# Patient Record
Sex: Male | Born: 1935 | Race: White | Hispanic: No | Smoking: Former smoker
Health system: Southern US, Community
[De-identification: ages and names within clinical notes are randomized; demographics above are authoritative.]

## PROBLEM LIST (undated history)

## (undated) DIAGNOSIS — J449 Chronic obstructive pulmonary disease, unspecified: Secondary | ICD-10-CM

## (undated) DIAGNOSIS — L570 Actinic keratosis: Secondary | ICD-10-CM

## (undated) DIAGNOSIS — I10 Essential (primary) hypertension: Secondary | ICD-10-CM

## (undated) DIAGNOSIS — K219 Gastro-esophageal reflux disease without esophagitis: Secondary | ICD-10-CM

## (undated) DIAGNOSIS — H9190 Unspecified hearing loss, unspecified ear: Secondary | ICD-10-CM

## (undated) DIAGNOSIS — D126 Benign neoplasm of colon, unspecified: Secondary | ICD-10-CM

## (undated) DIAGNOSIS — K5790 Diverticulosis of intestine, part unspecified, without perforation or abscess without bleeding: Secondary | ICD-10-CM

## (undated) DIAGNOSIS — E669 Obesity, unspecified: Secondary | ICD-10-CM

## (undated) DIAGNOSIS — I2510 Atherosclerotic heart disease of native coronary artery without angina pectoris: Secondary | ICD-10-CM

## (undated) DIAGNOSIS — E785 Hyperlipidemia, unspecified: Secondary | ICD-10-CM

## (undated) DIAGNOSIS — K2270 Barrett's esophagus without dysplasia: Secondary | ICD-10-CM

## (undated) HISTORY — DX: Diverticulosis of intestine, part unspecified, without perforation or abscess without bleeding: K57.90

## (undated) HISTORY — DX: Benign neoplasm of colon, unspecified: D12.6

## (undated) HISTORY — DX: Gastro-esophageal reflux disease without esophagitis: K21.9

## (undated) HISTORY — DX: Unspecified hearing loss, unspecified ear: H91.90

## (undated) HISTORY — DX: Barrett's esophagus without dysplasia: K22.70

## (undated) HISTORY — DX: Chronic obstructive pulmonary disease, unspecified: J44.9

## (undated) HISTORY — DX: Hyperlipidemia, unspecified: E78.5

## (undated) HISTORY — DX: Essential (primary) hypertension: I10

## (undated) HISTORY — DX: Atherosclerotic heart disease of native coronary artery without angina pectoris: I25.10

## (undated) HISTORY — DX: Obesity, unspecified: E66.9

## (undated) HISTORY — DX: Actinic keratosis: L57.0

---

## 1996-04-27 HISTORY — PX: TESTICLE SURGERY: SHX794

## 1998-02-08 ENCOUNTER — Other Ambulatory Visit: Admission: RE | Admit: 1998-02-08 | Discharge: 1998-02-08 | Payer: Self-pay | Admitting: Gastroenterology

## 1999-03-28 ENCOUNTER — Encounter (INDEPENDENT_AMBULATORY_CARE_PROVIDER_SITE_OTHER): Payer: Self-pay | Admitting: Specialist

## 1999-03-28 ENCOUNTER — Other Ambulatory Visit: Admission: RE | Admit: 1999-03-28 | Discharge: 1999-03-28 | Payer: Self-pay | Admitting: Gastroenterology

## 2006-11-17 ENCOUNTER — Ambulatory Visit: Payer: Self-pay | Admitting: Gastroenterology

## 2006-11-26 ENCOUNTER — Ambulatory Visit: Payer: Self-pay | Admitting: Gastroenterology

## 2006-11-26 ENCOUNTER — Encounter: Payer: Self-pay | Admitting: Gastroenterology

## 2008-11-05 ENCOUNTER — Encounter (INDEPENDENT_AMBULATORY_CARE_PROVIDER_SITE_OTHER): Payer: Self-pay | Admitting: *Deleted

## 2009-06-14 ENCOUNTER — Telehealth: Payer: Self-pay | Admitting: Gastroenterology

## 2009-06-17 ENCOUNTER — Encounter: Payer: Self-pay | Admitting: Gastroenterology

## 2009-06-17 ENCOUNTER — Ambulatory Visit: Payer: Self-pay | Admitting: Internal Medicine

## 2009-06-17 DIAGNOSIS — R1319 Other dysphagia: Secondary | ICD-10-CM

## 2009-06-17 DIAGNOSIS — Z860100 Personal history of colon polyps, unspecified: Secondary | ICD-10-CM | POA: Insufficient documentation

## 2009-06-17 DIAGNOSIS — K219 Gastro-esophageal reflux disease without esophagitis: Secondary | ICD-10-CM | POA: Insufficient documentation

## 2009-06-17 DIAGNOSIS — E1140 Type 2 diabetes mellitus with diabetic neuropathy, unspecified: Secondary | ICD-10-CM | POA: Insufficient documentation

## 2009-06-17 DIAGNOSIS — I10 Essential (primary) hypertension: Secondary | ICD-10-CM | POA: Insufficient documentation

## 2009-06-17 DIAGNOSIS — R1310 Dysphagia, unspecified: Secondary | ICD-10-CM | POA: Insufficient documentation

## 2009-06-17 DIAGNOSIS — K5730 Diverticulosis of large intestine without perforation or abscess without bleeding: Secondary | ICD-10-CM | POA: Insufficient documentation

## 2009-06-17 DIAGNOSIS — K2270 Barrett's esophagus without dysplasia: Secondary | ICD-10-CM

## 2009-06-21 ENCOUNTER — Ambulatory Visit: Payer: Self-pay | Admitting: Gastroenterology

## 2009-06-25 ENCOUNTER — Encounter: Payer: Self-pay | Admitting: Gastroenterology

## 2010-05-29 NOTE — Miscellaneous (Signed)
  Clinical Lists Changes  Medications: Added new medication of PEPCID 40 MG  TABS (FAMOTIDINE) take 1 tab once daily - Signed Rx of PEPCID 40 MG  TABS (FAMOTIDINE) take 1 tab once daily;  #30 x 1;  Signed;  Entered by: Louis Meckel MD;  Authorized by: Louis Meckel MD;  Method used: [facility] [address] [phone]*, [address], [place], [place]  [address], Ph: [phone] or [phone], Fax: [phone]    Prescriptions: PEPCID 40 MG  TABS (FAMOTIDINE) take 1 tab once daily  #30 x 1   Entered and Authorized by:   Louis Meckel MD   Signed by:   Louis Meckel MD on 06/21/2009   Method used:   [facility]ronically to        [facility] [address] [phone]* (retail)       [address]       [place], [place]  [address]       Ph: [phone] or [phone]       Fax: [phone]   RxID:   [id number]

## 2010-05-29 NOTE — Procedures (Signed)
Summary: Upper Endoscopy  Patient: Philip Hatfield Note: All result statuses are Final unless otherwise noted.  Tests: (1) Upper Endoscopy (EGD)   EGD Upper Endoscopy       DONE     [facility]     [address]     [place], [place]  [address]           ENDOSCOPY PROCEDURE REPORT           PATIENT:  [name], [name]  MR#:  [id number]     BIRTHDATE:  [birth year], 73 yrs. old  GENDER:  male           ENDOSCOPIST:  Barbette Hair. Arlyce Dice, MD     Referred by:           PROCEDURE DATE:  06/21/2009     PROCEDURE:  EGD with biopsy, Maloney Dilation of Esophagus     ASA CLASS:  Class III     INDICATIONS:  dysphagia, h/o Barrett's Esophagus           MEDICATIONS:   Fentanyl 25 mcg IV, Versed 3 mg IV, glycopyrrolate     (Robinal) 0.2 mg IV, 0.6cc simethancone 0.6 cc PO     TOPICAL ANESTHETIC:  Exactacain Spray           DESCRIPTION OF PROCEDURE:   After the risks benefits and     alternatives of the procedure were thoroughly explained, informed     consent was obtained.  The [facility] [id number] endoscope was     introduced through the mouth and advanced to the third portion of     the duodenum, without limitations.  The instrument was slowly     withdrawn as the mucosa was fully examined.     <<PROCEDUREIMAGES>>           Barrett's esophagus was found at the gastroesophageal junction.     Barrett's esophagus extending 2-3cm proximally. Multiple bxs     taken. 52Fr maloney dilator was passed b/c of dysphagia (see     image1 and image4).  Multiple erosions were found in the antrum     (see image2). Several superficial erosions  diverticulitis in the     bulb of the duodenum (see image3).  Otherwise the examination was     normal.    Retroflexed views revealed no abnormalities.    The     scope was then withdrawn from the patient and the procedure     completed.           COMPLICATIONS:  None           ENDOSCOPIC IMPRESSION:     1) Barrett's esophagus at the gastroesophageal junction    2) Erosions, multiple in the antrum; duodenitis     3) Diverticulitis in the bulb of duodenum     4) Otherwise normal examination     RECOMMENDATIONS:     1) Pepcid 40 mg qd     2) followup as needed           REPEAT EXAM:   You will receive a letter from Dr. Arlyce Dice in 1-2     weeks, after reviewing the final pathology, with followup     recommendations.           ______________________________     Barbette Hair Arlyce Dice, MD           CC:  Royanne Foots MD           n.  eSIGNED:   Barbette Hair. Natanya Holecek at 06/21/2009 11:00 AM           Philip Hatfield, [id number]  Note: An exclamation mark (!) indicates a result that was not dispersed into the flowsheet. Document Creation Date: 06/21/2009 11:00 AM _______________________________________________________________________  (1) Order result status: Final Collection or observation date-time: 06/21/2009 10:46 Requested date-time:  Receipt date-time:  Reported date-time:  Referring Physician:   Ordering Physician: Melvia Heaps [phone]) Specimen Source:  Source: Launa Grill Order Number: [id number] Lab site:   Appended Document: Endoscopy     Procedures Next Due Date:    EGD: 05/2011

## 2010-05-29 NOTE — Progress Notes (Signed)
Summary: TRIAGE--Dysphagia   Phone Note Call from Patient Call back at Home Phone [phone]   Caller: wife, Kendal Hymen Call For: Dr. Arlyce Dice Reason for Call: Talk to Nurse Summary of Call: wife reports pt is having dysphagia and cannot wait until next available Initial call taken by: Vallarie Mare,  June 14, 2009 8:24 AM  Follow-up for Phone Call        No answer, I will try back later. Laureen Ochs LPN  June 14, 2009 9:09 AM   Per pt. wife, 1 week of trouble swallowing. Hx. Barretts. Takes an antacid as needed. He is in [facility] today, cannot come to office until Monday.  1) See Amy Esterwood PAC 06-17-09 at 9am 2) Soft,bland diet. Be very careful with meats,rice and breads. 3) Prilosec OTC two times a day until appt. 4) If symptoms become worse call back immediately or go to ER.   Follow-up by: Laureen Ochs LPN,  June 14, 2009 9:44 AM

## 2010-05-29 NOTE — Letter (Signed)
Summary: Patient Notice-Barrett's [facility]  [address] [place], [place] [address]   Phone: [phone]  Fax: [phone]        June 25, 2009 MRN: [id number]    Philip Hatfield [address] [place], [place]  [address]    Dear Mr. BADIE,  I am pleased to inform you that the biopsies taken during your recent endoscopic examination did not show any evidence of cancer upon pathologic examination.  However, your biopsies indicate you have a condition known as Barrett's esophagus. While not cancer, it is pre-cancerous (can progress to cancer) and needs to be monitored with repeat endoscopic examination and biopsies.  Fortunately, it is quite rare that this develops into cancer, but careful monitoring of the condition along with taking your medication as prescribed is important in reducing the risk of developing cancer.  It is my recommendation that you have a repeat upper gastrointestinal endoscopic examination in _2 years.  Additional information/recommendations:  __Please call [phone] to schedule a return visit to further      evaluate your condition.  _x_Continue with treatment plan as outlined the day of your exam.  Please call us if you have or develop heartburn, reflux symptoms, any swallowing problems, or if you have questions about your condition that have not been fully answered at this time.  Sincerely,  Louis Meckel MD  This letter has been electronically signed by your physician.  Appended Document: Patient Notice-Barrett's Esopghagus letter mailed 3.3.11

## 2010-05-29 NOTE — Procedures (Signed)
Summary: [facility] COLON   Colonoscopy  Procedure date:  11/26/2006  Findings:      Location:  [facility].   Patient Name: Philip Hatfield, Philip Hatfield MRN:  Procedure Procedures: Colonoscopy CPT: 20254.  Personnel: Endoscopist: Barbette Hair. Arlyce Dice, MD.  Patient Consent: Procedure, Alternatives, Risks and Benefits discussed, consent obtained, from patient.  Indications  Surveillance of: Adenomatous Polyp(s). 2005.  History Comments: Patient history reviewed/updated, physical performed prior to initiation of sedation? Exam Exam: Extent of exam reached: Cecum, extent intended: Cecum.  The cecum was identified by appendiceal orifice and IC valve. Time to Cecum: 00:02: 41. Time for Withdrawl: 00:07:02. Colon retroflexion performed. ASA Classification: II. Tolerance: good.  Monitoring: Pulse and BP monitoring, Oximetry used. Supplemental O2 given. at 2 Liters.  Colon Prep Used Miralax for colon prep.  Sedation Meds: Patient assessed and found to be appropriate for moderate (conscious) sedation. Sedation was managed by the Endoscopist. Fentanyl given IV. Versed given IV.  Findings - NORMAL EXAM: Ascending Colon to Descending Colon.  - DIVERTICULOSIS: Descending Colon to Sigmoid Colon. ICD9: Diverticulosis: 562.10. Comments: Diffuse diverticulosis.  OTHER FINDING: Ascending Colon. Comments: 3cm lipoma.  NORMAL EXAM: Cecum.  - NORMAL EXAM: Sigmoid Colon to Rectum.   Assessment Abnormal examination, see findings above.  Diagnoses: 562.10: Diverticulosis.   Events  Unplanned Interventions: No intervention was required.  Unplanned Events: There were no complications. Plans  Post Exam Instructions: Post sedation instructions given.  Patient Education: Patient given standard instructions for: Diverticulosis.  Disposition: After procedure patient sent to recovery. After recovery patient sent home.  Scheduling/Referral: Colonoscopy, to Barbette Hair. Arlyce Dice, MD, around Nov 25, 2013.    cc: Royanne Foots, MD  This report was created from the original endoscopy report, which was reviewed and signed by the above listed endoscopist.

## 2010-05-29 NOTE — Procedures (Signed)
Summary: [facility] EGD   EGD  Procedure date:  11/26/2006  Findings:      Location: [facility]   Patient Name: Philip Hatfield, Philip Hatfield MRN:  Procedure Procedures: Panendoscopy (EGD) CPT: 43235.    with biopsy(s)/brushing(s). CPT: D1846139.  Personnel: Endoscopist: Barbette Hair. Arlyce Dice, MD.  Patient Consent: Procedure, Alternatives, Risks and Benefits discussed, consent obtained, from patient.  Indications  Surveillance of: Barrett's Esophagus.  Exam Exam Info: Maximum depth of insertion Duodenum, intended Duodenum. Patient position: on left side. ASA Classification: II. Tolerance: excellent.  Sedation Meds: Patient assessed and found to be appropriate for moderate (conscious) sedation. Residual sedation present from prior procedure today. Fentanyl given IV. Versed given IV. Robinul 0.2 given IV. Cetacaine Spray 2 sprays given aerosolized.  Monitoring: BP and pulse monitoring done. Oximetry used. Supplemental O2 given at 2 Liters.  Findings - Normal: Proximal Esophagus to Mid Esophagus.  - Normal: Cardia to Duodenal 2nd Portion.  - BARRETT'S ESOPHAGUS:  established. Proximal margin 37 cm from mouth,  distal margin 40 cm. Length of Barrett's 3 cm. Biopsy/Barrett's taken. ICD9: Barrett's: 530.2. Comment: One area of nodularity extending 1cm.   Assessment Abnormal examination, see findings above.  Diagnoses: 530.2: Barrett's.   Events  Unplanned Intervention: No unplanned interventions were required.  Unplanned Events: There were no complications. Plans Medication(s): Await pathology.  Scheduling: EGD, to Molly Maduro D. Arlyce Dice, MD, around Nov 25, 2008.    cc: Royanne Foots, MD  This report was created from the original endoscopy report, which was reviewed and signed by the above listed endoscopist.

## 2010-05-29 NOTE — Assessment & Plan Note (Signed)
Summary: WORSENING DYSPHAGIA, HX.BARRETT'S       (DR.KAPLAN PT)   DEBORAH    History of Present Illness Visit Type: Initial Visit Primary GI MD: Melvia Heaps MD [facility] Chief Complaint: Pt states x 1 week ago pt realized his throat has been really sore. Pt states the soreness would come and go. Pt also states his some solid food dysphagia.  History of Present Illness:   75 YO. MALE KNOWN TO DR. KAPLAN WITH HX.OF BARRETTS ESOPHAGUS,AND COLON POLYPS. LAST EGD WAS DONE IN 8/08 WITH BARRETTS ON BX-NO DYSPLASIA. HE DID NOT HAVE A STRICTURE. LAST COLONOSCOPY WAS ALSO DONE 8/08 WITH DIFFUSE DIVERTICULOSIS,A CECAL LIPOMA,NO POLYPS.  HE COMES IN TODAY WITH NEW C/O SOLID FOOD DYSPHAGIA WHICH HAS BEEN PERSISTENT OVER THE LAST COUPLE WEEKS. HE HAS ALSO BEEN HAVING HEARTBURN AND INDIGESTION.HE USES TUMS WITHOUT MUCH BENEFIT-IS NOT ON A REGULAR PPI. HE DENIES ANY DIFFICULTY SWALLOWING LIQUIDS BUT FEELS SOLIDS ARE STICKING.NO REGURGITATION. DENIES ABDOMINAL PAIN, NO NAUSEA, VOMITING ETC. BM'S NORMAL,NO MELENA OR HEME.HE TAKES INDOCIN PERIODICALLY FOR GOUT.   GI Review of Systems    Reports acid reflux, bloating, and  dysphagia with solids.      Denies abdominal pain, belching, chest pain, dysphagia with liquids, heartburn, loss of appetite, nausea, vomiting, vomiting blood, weight loss, and  weight gain.        Denies anal fissure, black tarry stools, change in bowel habit, constipation, diarrhea, diverticulosis, fecal incontinence, heme positive stool, hemorrhoids, irritable bowel syndrome, jaundice, light color stool, liver problems, rectal bleeding, and  rectal pain. Preventive Screening-Counseling & Management  Alcohol-Tobacco     Smoking Status: quit      Drug Use:  no.      Current Medications (verified): 1)  Atenolol 50 Mg Tabs (Atenolol) .... One Tablet By Mouth Once Daily 2)  Metformin Hcl 500 Mg Tabs (Metformin Hcl) .... One Tablet By Mouth Two Times A Day 3)  Tums 500 Mg Chew (Calcium  Carbonate Antacid) .... 2-3 Tablets By Mouth Once Daily 4)  Indomethacin 50 Mg Caps (Indomethacin) .... One Tablet By Mouth Three Times A Day 5)  Furosemide 20 Mg Tabs (Furosemide) .... One Tablet By Mouth Once Daily 6)  Tamsulosin Hcl 0.4 Mg Caps (Tamsulosin Hcl) .... One Tablet By Mouth Every 12 Hours 7)  Diovan Hct 160-25 Mg Tabs (Valsartan-Hydrochlorothiazide) .... One Tablet By Mouth Once Daily 8)  Aspirin 325 Mg  Tabs (Aspirin) .... One Tablet By Mouth Once Daily  Allergies (verified): No Known Drug Allergies  Past History:  Past Medical History: Barretts Esophagus DIFFUSE DIVERTICULOSIS Adenomatous Colon Polyps 2005,NO POLYPS 2008 Ephysema COPD Diabetes Obesity  Past Surgical History: Unremarkable  Family History: Family History of Prostate Cancer:Brother and Father Family History of Diabetes: Uncles Family History of Heart Disease: Mother, Father  Social History: Married  Retired Patient is a former smoker.  Alcohol Use - no Daily Caffeine Use Illicit Drug Use - no Smoking Status:  quit Drug Use:  no  Review of Systems       The patient complains of hearing problems, shortness of breath, sore throat, and swelling of feet/legs.  The patient denies allergy/sinus, anemia, anxiety-new, arthritis/joint pain, back pain, blood in urine, breast changes/lumps, change in vision, confusion, cough, coughing up blood, depression-new, fainting, fatigue, fever, headaches-new, heart murmur, heart rhythm changes, itching, menstrual pain, muscle pains/cramps, night sweats, nosebleeds, pregnancy symptoms, skin rash, sleeping problems, swollen lymph glands, thirst - excessive , urination - excessive , urination changes/pain, urine leakage, vision  changes, and voice change.         ROS OTHERWISE AS IN HPI  Vital Signs:  Patient profile:   75 year old male Height:      71 inches Weight:      291 pounds BMI:     40.73 Pulse rate:   78 / minute Pulse rhythm:   regular BP sitting:    178 / 84  (right arm) Cuff size:   regular  Vitals Entered By: Christie Nottingham CMA Duncan Dull) (June 17, 2009 9:19 AM)  Physical Exam  General:  Well developed, well nourished, no acute distress. Head:  Normocephalic and atraumatic. Eyes:  PERRLA, no icterus. Lungs:  Clear throughout to auscultation. Heart:  Regular rate and rhythm; no murmurs, rubs,  or bruits. Abdomen:  OBESE,SOFT, NONTENDER, NO MASS OR HSM,BS+ Rectal:  NOT DONE Extremities:  No clubbing, cyanosis, edema or deformities noted. Neurologic:  Alert and  oriented x4;  grossly normal neurologically. Psych:  Alert and cooperative. Normal mood and affect.   Impression & Recommendations:  Problem # 1:  DYSPHAGIA (ICD-787.29) Assessment New 75 YO MALE WITH HX OF BARRETTS ESOPHAGUS WITH NEW C/O SOLID FOOD DYSPHAGIA, R/O STRICTURE,R/O MALIGNANCY.  START PRILOSEC OTC 20 MG EVERY DAY IN AM BEFORE BREAKFAST-EMPHASIZED THE IMPORTANCE OF STAYING ON A PPI. SCHEDULE FOR EGD WITH POSSIBLE SAVARY DILATION WITH DR. KAPLAN.;PROCEDURE DISCUSSED IN DETAIL WITH PT.  Problem # 2:  PERSONAL HX COLONIC POLYPS (ICD-V12.72) Assessment: Comment Only ADENOMATOUS-LAST COLONOSCOPY 2008,NEGATIVE-DUE FOR FOLLOW UP 2013.  Problem # 3:  DIVERTICULOSIS-COLON (ICD-562.10) Assessment: Comment Only  Problem # 4:  DIABETES MELLITUS-TYPE II (ICD-250.00) Assessment: Comment Only  Problem # 5:  HYPERTENSION (ICD-401.9) Assessment: Comment Only  Other Orders: EGD SAV (EGD SAV)  Patient Instructions: 1)  Endoscopy scheduled for 06-21-09 with Dr. Arlyce Dice.  2)  Endoscopy brochure provided. 3)  [facility] Patient Information Guide given.

## 2010-05-29 NOTE — Letter (Signed)
Summary: EGD Instructions  [facility]  [address] [place], [place] [address]   Phone: [phone]  Fax: [phone]       Philip Hatfield    [birth year]    MRN: [id number]       Procedure Day /Date:06-21-09     Arrival Time: 9:00 AM     Procedure Time:10: 00 AM     Location of Procedure:                    X   [facility] (4th Floor)  PREPARATION FOR ENDOSCOPY   On 06-21-09 THE DAY OF THE PROCEDURE:  1.   No solid foods, milk or milk products are allowed after midnight the night before your procedure.  2.   Do not drink anything colored red or purple.  Avoid juices with pulp.  No orange juice.  3.  You may drink clear liquids until 8:00 AM, which is 2 hours before your procedure.                                                                                                CLEAR LIQUIDS INCLUDE: Water Jello Ice Popsicles Tea (sugar ok, no milk/cream) Powdered fruit flavored drinks Coffee (sugar ok, no milk/cream) Gatorade Juice: apple, white grape, white cranberry  Lemonade Clear bullion, consomm, broth Carbonated beverages (any kind) Strained chicken noodle soup Hard Candy   MEDICATION INSTRUCTIONS  Unless otherwise instructed, you should take regular prescription medications with a small sip of water as early as possible the morning of your procedure.  Diabetic patients - see separate instructions.   Additional medication instructions:  Stop taking Aspirin on Tuesday 06-18-09.         OTHER INSTRUCTIONS  You will need a responsible adult at least 73 years of age to accompany you and drive you home.   This person must remain in the waiting room during your procedure.  Wear loose fitting clothing that is easily removed.  Leave jewelry and other valuables at home.  However, you may wish to bring a book to read or an iPod/MP3 player to listen to music as you wait for your procedure to start.  Remove all body piercing jewelry and  leave at home.  Total time from sign-in until discharge is approximately 2-3 hours.  You should go home directly after your procedure and rest.  You can resume normal activities the day after your procedure.  The day of your procedure you should not:   Drive   Make legal decisions   Operate machinery   Drink alcohol   Return to work  You will receive specific instructions about eating, activities and medications before you leave.    The above instructions have been reviewed and explained to me by   _______________________    I fully understand and can verbalize these instructions _____________________________ Date _________

## 2010-05-29 NOTE — Letter (Signed)
Summary: Diabetic Instructions  [facility]  [address] [place], [place] [address]   Phone: [phone]  Fax: [phone]    Philip Hatfield 01/06/1936 MRN: [id number]   X   ORAL DIABETIC MEDICATION INSTRUCTIONS  The day before your procedure:   Take your diabetic pill as you do normally  The day of your procedure:   Do not take your diabetic pill    We will check your blood sugar levels during the admission process and again in Recovery before discharging you home  ________________________________________________________________________  _  _   INSULIN (LONG ACTING) MEDICATION INSTRUCTIONS (Lantus, NPH, 70/30, Humulin, Novolin-N)   The day before your procedure:   Take  your regular evening dose    The day of your procedure:   Do not take your morning dose    _  _   INSULIN (SHORT ACTING) MEDICATION INSTRUCTIONS (Regular, Humulog, Novolog)   The day before your procedure:   Do not take your evening dose   The day of your procedure:   Do not take your morning dose   _  _   INSULIN PUMP MEDICATION INSTRUCTIONS  We will contact the physician managing your diabetic care for written dosage instructions for the day before your procedure and the day of your procedure.  Once we have received the instructions, we will contact you.

## 2010-07-18 LAB — GLUCOSE, CAPILLARY
Glucose-Capillary: 101 mg/dL — ABNORMAL HIGH (ref 70–99)
Glucose-Capillary: 97 mg/dL (ref 70–99)

## 2010-08-27 ENCOUNTER — Encounter: Payer: Self-pay | Admitting: Critical Care Medicine

## 2010-08-28 ENCOUNTER — Ambulatory Visit (INDEPENDENT_AMBULATORY_CARE_PROVIDER_SITE_OTHER): Payer: Medicare HMO | Admitting: Critical Care Medicine

## 2010-08-28 ENCOUNTER — Encounter: Payer: Self-pay | Admitting: Critical Care Medicine

## 2010-08-28 DIAGNOSIS — I2510 Atherosclerotic heart disease of native coronary artery without angina pectoris: Secondary | ICD-10-CM | POA: Insufficient documentation

## 2010-08-28 DIAGNOSIS — J449 Chronic obstructive pulmonary disease, unspecified: Secondary | ICD-10-CM

## 2010-08-28 DIAGNOSIS — I10 Essential (primary) hypertension: Secondary | ICD-10-CM | POA: Insufficient documentation

## 2010-08-28 DIAGNOSIS — K219 Gastro-esophageal reflux disease without esophagitis: Secondary | ICD-10-CM

## 2010-08-28 DIAGNOSIS — E785 Hyperlipidemia, unspecified: Secondary | ICD-10-CM

## 2010-08-28 DIAGNOSIS — J438 Other emphysema: Secondary | ICD-10-CM

## 2010-08-28 DIAGNOSIS — E119 Type 2 diabetes mellitus without complications: Secondary | ICD-10-CM | POA: Insufficient documentation

## 2010-08-28 DIAGNOSIS — J439 Emphysema, unspecified: Secondary | ICD-10-CM | POA: Insufficient documentation

## 2010-08-28 MED ORDER — FAMOTIDINE 40 MG PO TABS: 40.0000 mg | ORAL_TABLET | Freq: Every day | ORAL | Status: DC

## 2010-08-28 MED ORDER — OMEPRAZOLE 20 MG PO CPDR: 20.0000 mg | DELAYED_RELEASE_CAPSULE | Freq: Every day | ORAL | Status: DC

## 2010-08-28 MED ORDER — TIOTROPIUM BROMIDE MONOHYDRATE 18 MCG IN CAPS: 18.0000 ug | ORAL_CAPSULE | Freq: Every day | RESPIRATORY_TRACT | Status: DC

## 2010-08-28 NOTE — Patient Instructions (Signed)
An overnight sleep oximetry will be obtained Start Spiriva daily Stay on Advair Resume pepcid and omeprazole daily You should start Aspirin at least 81mg  daily  A full pulmonary function study will be obtained Return 2 months

## 2010-08-28 NOTE — Progress Notes (Signed)
Subjective:    Patient ID: Philip Hatfield, male    DOB: 23-Jul-1935, 75 y.o.   MRN: [id number]  Shortness of Breath This is a chronic problem. The current episode started more than 1 year ago. The problem occurs daily (gets bronchitis 1-2 times per year,  is short of breath daily, exertional,  occ at rest.  Occ short of breath at night,  any ADL will ppt symptoms). The problem has been gradually worsening. Duration: recovery time  Associated symptoms include leg swelling, swollen glands and wheezing. Pertinent negatives include no abdominal pain, chest pain, ear pain, fever, headaches, hemoptysis, neck pain, orthopnea, PND, rash, rhinorrhea, sore throat, sputum production or vomiting. The symptoms are aggravated by any activity, fumes, odors, weather changes and exercise. Associated symptoms comments: Mucus is creamy and usually when first arises . He has tried beta agonist inhalers and steroid inhalers for the symptoms. The treatment provided moderate relief. His past medical history is significant for COPD and DVT. There is no history of asthma, CAD, a heart failure or pneumonia. (DVT 51yrs ago, on coumadin, now off )   75 y.o.WM dx COPD 2010.  Quit smoking 2000.  Pt self referred.  See symptoms as above.   Past Medical History  Diagnosis Date  . Barrett's esophagus   . Diverticulosis   . Adenomatous colon polyp   . Emphysema   . COPD (chronic obstructive pulmonary disease)   . Diabetes mellitus   . Obesity   . Hypertension   . Hyperlipemia   . CAD (coronary artery disease)   . Gout   . Actinic keratosis      Family History  Problem Relation Age of Onset  . Prostate cancer Brother   . Prostate cancer Father   . Heart disease Mother   . Heart disease Father   . Lung cancer Brother      History   Social History  . Marital Status: Married    Spouse Name: N/A    Number of Children: 4  . Years of Education: N/A   Occupational History  . retired     [employer]al architect  .  Driver     part time    Social History Main Topics  . Smoking status: Former Smoker -- 1.5 packs/day for 45 years    Types: Cigarettes    Quit date: 04/27/1998  . Smokeless tobacco: Never Used  . Alcohol Use: No  . Drug Use: No  . Sexually Active: Not on file   Other Topics Concern  . Not on file   Social History Narrative  . No narrative on file     No Known Allergies   Outpatient Prescriptions Prior to Visit  Medication Sig Dispense Refill  . atenolol (TENORMIN) 50 MG tablet Take 50 mg by mouth daily.        . furosemide (LASIX) 20 MG tablet Take 20 mg by mouth daily.        . indomethacin (INDOCIN) 50 MG capsule Take 50 mg by mouth 3 (three) times daily as needed.       . metFORMIN (GLUMETZA) 500 MG (MOD) 24 hr tablet Take 500 mg by mouth 2 (two) times daily with a meal.        . Tamsulosin HCl (FLOMAX) 0.4 MG CAPS Take 0.4 mg by mouth 2 (two) times daily.        Marland Kitchen aspirin 325 MG tablet Take 325 mg by mouth daily.        Marland Kitchen  calcium carbonate (TUMS - DOSED IN MG ELEMENTAL CALCIUM) 500 MG chewable tablet Chew 2 tablets by mouth daily.        . famotidine (PEPCID) 40 MG tablet Take 40 mg by mouth daily.        Marland Kitchen omeprazole (PRILOSEC) 20 MG capsule Take 20 mg by mouth daily.        . valsartan-hydrochlorothiazide (DIOVAN-HCT) 160-25 MG per tablet Take 1 tablet by mouth daily.          ` Review of Systems  Constitutional: Positive for diaphoresis and fatigue. Negative for fever, chills, activity change, appetite change and unexpected weight change.  HENT: Positive for hearing loss, trouble swallowing and neck stiffness. Negative for ear pain, nosebleeds, congestion, sore throat, facial swelling, rhinorrhea, sneezing, mouth sores, neck pain, dental problem, voice change, postnasal drip, sinus pressure, tinnitus and ear discharge.   Eyes: Positive for itching. Negative for photophobia, discharge and visual disturbance.  Respiratory: Positive for cough, shortness of breath and  wheezing. Negative for apnea, hemoptysis, sputum production, choking, chest tightness and stridor.   Cardiovascular: Positive for leg swelling. Negative for chest pain, palpitations, orthopnea and PND.  Gastrointestinal: Positive for constipation. Negative for nausea, vomiting, abdominal pain, blood in stool and abdominal distention.  Genitourinary: Positive for frequency. Negative for dysuria, urgency, hematuria, flank pain, decreased urine volume and difficulty urinating.  Musculoskeletal: Positive for gait problem. Negative for myalgias, back pain, joint swelling and arthralgias.  Skin: Negative for color change, pallor and rash.  Neurological: Positive for weakness and light-headedness. Negative for dizziness, tremors, seizures, syncope, speech difficulty, numbness and headaches.  Hematological: Negative for adenopathy. Bruises/bleeds easily.  Psychiatric/Behavioral: Positive for agitation. Negative for confusion and sleep disturbance. The patient is nervous/anxious.        Objective:   Physical Exam Filed Vitals:   08/28/10 1452  BP: 116/64  Pulse: 50  Temp: 98.2 F (36.8 C)  TempSrc: Oral  Height: 5\' 10"  (1.778 m)  Weight: 275 lb (124.739 kg)  SpO2: 92%    Gen: Pleasant, well-nourished, in no distress,  normal affect  ENT: No lesions,  mouth clear,  oropharynx clear, no postnasal drip  Neck: No JVD, no TMG, no carotid bruits  Lungs: No use of accessory muscles, no dullness to percussion, distant BS   Cardiovascular: RRR, heart sounds normal, no murmur or gallops, no peripheral edema  Abdomen: soft and NT, no HSM,  BS normal  Musculoskeletal: No deformities, no cyanosis or clubbing  Neuro: alert, non focal  Skin: Warm, no lesions or rashes       CXR 4/14:  Mild CHF, mild volume overload, COPD changes Assessment & Plan:   COPD (chronic obstructive pulmonary disease) Moderate Copd GERD a ppt factor, ? Nocturnal desaturation oxygen Plan An overnight sleep  oximetry will be obtained Start Spiriva daily Stay on Advair Resume pepcid and omeprazole daily You should start Aspirin at least 81mg  daily  A full pulmonary function study will be obtained Return 2 months      Updated Medication List Outpatient Encounter Prescriptions as of 08/28/2010  Medication Sig Dispense Refill  . albuterol (PROVENTIL HFA) 108 (90 BASE) MCG/ACT inhaler Inhale 2 puffs into the lungs every 8 (eight) hours.        Marland Kitchen atenolol (TENORMIN) 50 MG tablet Take 50 mg by mouth daily.        . Fluticasone-Salmeterol (ADVAIR DISKUS) 250-50 MCG/DOSE AEPB Inhale 1 puff into the lungs every 12 (twelve) hours.        Marland Kitchen  furosemide (LASIX) 20 MG tablet Take 20 mg by mouth daily.        . indomethacin (INDOCIN) 50 MG capsule Take 50 mg by mouth 3 (three) times daily as needed.       Marland Kitchen losartan-hydrochlorothiazide (HYZAAR) 100-25 MG per tablet Take 1 tablet by mouth daily.        . metFORMIN (GLUMETZA) 500 MG (MOD) 24 hr tablet Take 500 mg by mouth 2 (two) times daily with a meal.        . Tamsulosin HCl (FLOMAX) 0.4 MG CAPS Take 0.4 mg by mouth 2 (two) times daily.        . famotidine (PEPCID) 40 MG tablet Take 1 tablet (40 mg total) by mouth daily.  30 tablet  6  . omeprazole (PRILOSEC) 20 MG capsule Take 1 capsule (20 mg total) by mouth daily. Take 1/2 hour before mea then eat  30 capsule  6  . tiotropium (SPIRIVA HANDIHALER) 18 MCG inhalation capsule Place 1 capsule (18 mcg total) into inhaler and inhale daily.  30 capsule  6  . DISCONTD: aspirin 325 MG tablet Take 325 mg by mouth daily.        Marland Kitchen DISCONTD: calcium carbonate (TUMS - DOSED IN MG ELEMENTAL CALCIUM) 500 MG chewable tablet Chew 2 tablets by mouth daily.        Marland Kitchen DISCONTD: famotidine (PEPCID) 40 MG tablet Take 40 mg by mouth daily.        Marland Kitchen DISCONTD: omeprazole (PRILOSEC) 20 MG capsule Take 20 mg by mouth daily.        Marland Kitchen DISCONTD: valsartan-hydrochlorothiazide (DIOVAN-HCT) 160-25 MG per tablet Take 1 tablet by mouth daily.

## 2010-08-28 NOTE — Progress Notes (Signed)
  Subjective:    Patient ID: Philip Hatfield, male    DOB: 1936/03/23, 75 y.o.   MRN: [id number]  HPI    Review of Systems     Objective:   Physical Exam        Assessment & Plan:

## 2010-08-30 NOTE — Assessment & Plan Note (Signed)
Moderate Copd GERD a ppt factor, ? Nocturnal desaturation oxygen Plan An overnight sleep oximetry will be obtained Start Spiriva daily Stay on Advair Resume pepcid and omeprazole daily You should start Aspirin at least 81mg  daily  A full pulmonary function study will be obtained Return 2 months

## 2010-09-01 ENCOUNTER — Telehealth: Payer: Self-pay | Admitting: Critical Care Medicine

## 2010-09-01 NOTE — Telephone Encounter (Signed)
Spoke w/ daughter and I advised her pt was going to have ONO done first and see what those results are. She verbalized understanding and needed nothing further

## 2010-09-08 ENCOUNTER — Ambulatory Visit (INDEPENDENT_AMBULATORY_CARE_PROVIDER_SITE_OTHER): Payer: Medicare HMO | Admitting: Critical Care Medicine

## 2010-09-08 DIAGNOSIS — J449 Chronic obstructive pulmonary disease, unspecified: Secondary | ICD-10-CM

## 2010-09-08 LAB — PULMONARY FUNCTION TEST

## 2010-09-08 NOTE — Progress Notes (Signed)
PFT done today. 

## 2010-09-09 ENCOUNTER — Encounter: Payer: Self-pay | Admitting: Critical Care Medicine

## 2010-09-09 NOTE — Progress Notes (Signed)
Severe peripheral airflow obstruction with improvement in bronchodilator responsiveness. Following albuterol.

## 2010-09-17 ENCOUNTER — Telehealth: Payer: Self-pay | Admitting: Critical Care Medicine

## 2010-09-17 ENCOUNTER — Encounter: Payer: Self-pay | Admitting: Critical Care Medicine

## 2010-09-17 DIAGNOSIS — J449 Chronic obstructive pulmonary disease, unspecified: Secondary | ICD-10-CM

## 2010-09-17 NOTE — Telephone Encounter (Signed)
I spoke to pt daughter and advised of results and that order was sent to [facility] for oxygen qhs.Jennifer Yancey Flemings, CMA

## 2010-09-17 NOTE — Telephone Encounter (Signed)
I called the pts home,  Left message.  His ONO on RA severely abnormal,  Desats to 60% range  He will need 2L qhs oxygen therapy. Order sent to [facility].

## 2010-09-30 ENCOUNTER — Encounter: Payer: Self-pay | Admitting: Critical Care Medicine

## 2010-10-01 ENCOUNTER — Other Ambulatory Visit: Payer: Self-pay | Admitting: *Deleted

## 2010-10-01 DIAGNOSIS — K219 Gastro-esophageal reflux disease without esophagitis: Secondary | ICD-10-CM

## 2010-10-01 MED ORDER — OMEPRAZOLE 20 MG PO CPDR: 20.0000 mg | DELAYED_RELEASE_CAPSULE | Freq: Every day | ORAL | Status: DC

## 2010-10-01 MED ORDER — FAMOTIDINE 40 MG PO TABS: 40.0000 mg | ORAL_TABLET | Freq: Every day | ORAL | Status: DC

## 2010-11-03 ENCOUNTER — Ambulatory Visit (INDEPENDENT_AMBULATORY_CARE_PROVIDER_SITE_OTHER): Payer: Medicare HMO | Admitting: Critical Care Medicine

## 2010-11-03 ENCOUNTER — Encounter: Payer: Self-pay | Admitting: Critical Care Medicine

## 2010-11-03 VITALS — BP 134/78 | HR 42 | Temp 97.8°F | Ht 71.0 in | Wt 289.4 lb

## 2010-11-03 DIAGNOSIS — G4736 Sleep related hypoventilation in conditions classified elsewhere: Secondary | ICD-10-CM

## 2010-11-03 DIAGNOSIS — J438 Other emphysema: Secondary | ICD-10-CM

## 2010-11-03 DIAGNOSIS — J449 Chronic obstructive pulmonary disease, unspecified: Secondary | ICD-10-CM

## 2010-11-03 NOTE — Progress Notes (Signed)
Subjective:    Patient ID: Philip Hatfield, male    DOB: 1935/09/14, 75 y.o.   MRN: [id number]  Shortness of Breath This is a chronic problem. The current episode started more than 1 year ago. The problem occurs daily (gets bronchitis 1-2 times per year,  is short of breath daily, exertional,  occ at rest.  Occ short of breath at night,  any ADL will ppt symptoms). The problem has been gradually improving. Duration: recovery time  Pertinent negatives include no abdominal pain, chest pain, ear pain, fever, headaches, hemoptysis, leg swelling, neck pain, orthopnea, PND, rash, rhinorrhea, sore throat, sputum production, swollen glands, vomiting or wheezing. The symptoms are aggravated by any activity, fumes, odors, weather changes and exercise. He has tried beta agonist inhalers and steroid inhalers for the symptoms. The treatment provided moderate relief. His past medical history is significant for COPD and DVT. There is no history of asthma, CAD, a heart failure or pneumonia. (DVT 72yrs ago, on coumadin, now off )   75 y.o.WM dx COPD 2010.  Quit smoking 2000.  Pt self referred.  See symptoms as above.  11/03/2010 Seen first time 5/12 We rec: Start Spiriva daily  Stay on Advair  Resume pepcid and omeprazole daily  You should start Aspirin at least 81mg  daily   PFTs obtained as follows: PFT Conversion 09/09/2010  FVC 3.41  FVC PREDICT 4.47  FVC  % Predicted 76  FEV1 2.7  FEV1 PREDICT 2.97  FEV % Predicted 91  FEV1/FVC 79.2  FEV1/FVC PRE 68  FeF 25-75 2.69  FeF 25-75 % Predicted 103  Residual Volume (ml) 2.26  RV % EXPECT 85  DLCO (ml/mmHg sec) 19.8  DLCO % EXPEC 73  DLCO/VA 4.19  DLCO/VA%EXP 121   Since last ov doing well.  Better vs before.  ONO on RA abn.  No daytime hypersomnolence.   Past Medical History  Diagnosis Date  . Barrett's esophagus   . Diverticulosis   . Adenomatous colon polyp   . Emphysema   . COPD (chronic obstructive pulmonary disease)   . Diabetes  mellitus   . Obesity   . Hypertension   . Hyperlipemia   . CAD (coronary artery disease)   . Gout   . Actinic keratosis      Family History  Problem Relation Age of Onset  . Prostate cancer Brother   . Prostate cancer Father   . Heart disease Mother   . Heart disease Father   . Lung cancer Brother      History   Social History  . Marital Status: Married    Spouse Name: N/A    Number of Children: 4  . Years of Education: N/A   Occupational History  . 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 Smoking status: Former Smoker -- 1.5 packs/day for 45 years    Types: Cigarettes    Quit date: 04/27/1998  . Smokeless tobacco: Never Used  . Alcohol Use: No  . Drug Use: No  . Sexually Active: Not on file   Other Topics Concern  . Not on file   Social History Narrative  . No narrative on file     No Known Allergies   Outpatient Prescriptions Prior to Visit  Medication Sig Dispense Refill  . albuterol (PROVENTIL HFA) 108 (90 BASE) MCG/ACT inhaler Inhale 2 puffs into the lungs every 6 (six) hours  as needed.       Marland Kitchen atenolol (TENORMIN) 50 MG tablet Take 50 mg by mouth daily.        . furosemide (LASIX) 20 MG tablet Take 20 mg by mouth daily.        . indomethacin (INDOCIN) 50 MG capsule Take 50 mg by mouth 3 (three) times daily as needed.       Marland Kitchen losartan-hydrochlorothiazide (HYZAAR) 100-25 MG per tablet Take 1 tablet by mouth daily.        . metFORMIN (GLUMETZA) 500 MG (MOD) 24 hr tablet Take 500 mg by mouth 2 (two) times daily with a meal.        . omeprazole (PRILOSEC) 20 MG capsule Take 1 capsule (20 mg total) by mouth daily. Take 1/2 hour before mea then eat  90 capsule  4  . Tamsulosin HCl (FLOMAX) 0.4 MG CAPS Take 0.4 mg by mouth 2 (two) times daily.        Marland Kitchen tiotropium (SPIRIVA HANDIHALER) 18 MCG inhalation capsule Place 1 capsule (18 mcg total) into inhaler and inhale daily.  30 capsule  6  . famotidine (PEPCID) 40 MG  tablet Take 1 tablet (40 mg total) by mouth daily.  90 tablet  4  . Fluticasone-Salmeterol (ADVAIR DISKUS) 250-50 MCG/DOSE AEPB Inhale 1 puff into the lungs every 12 (twelve) hours.          ` Review of Systems  Constitutional: Positive for fatigue. Negative for fever, chills, diaphoresis, activity change, appetite change and unexpected weight change.  HENT: Positive for hearing loss and trouble swallowing. Negative for ear pain, nosebleeds, congestion, sore throat, facial swelling, rhinorrhea, sneezing, mouth sores, neck pain, neck stiffness, dental problem, voice change, postnasal drip, sinus pressure, tinnitus and ear discharge.   Eyes: Negative for photophobia, discharge, itching and visual disturbance.  Respiratory: Positive for shortness of breath. Negative for apnea, cough, hemoptysis, sputum production, choking, chest tightness, wheezing and stridor.   Cardiovascular: Negative for chest pain, palpitations, orthopnea, leg swelling and PND.  Gastrointestinal: Positive for constipation. Negative for nausea, vomiting, abdominal pain, blood in stool and abdominal distention.  Genitourinary: Negative for dysuria, urgency, frequency, hematuria, flank pain, decreased urine volume and difficulty urinating.  Musculoskeletal: Negative for myalgias, back pain, joint swelling, arthralgias and gait problem.  Skin: Negative for color change, pallor and rash.  Neurological: Positive for weakness. Negative for dizziness, tremors, seizures, syncope, speech difficulty, light-headedness, numbness and headaches.  Hematological: Negative for adenopathy. Bruises/bleeds easily.  Psychiatric/Behavioral: Positive for agitation. Negative for confusion and sleep disturbance. The patient is nervous/anxious.        Objective:   Physical Exam  Filed Vitals:   11/03/10 1156  BP: 134/78  Pulse: 42  Temp: 97.8 F (36.6 C)  TempSrc: Oral  Height: 5\' 11"  (1.803 m)  Weight: 289 lb 6.4 oz (131.271 kg)  SpO2: 97%      Gen: Pleasant, well-nourished, in no distress,  normal affect  ENT: No lesions,  mouth clear,  oropharynx clear, no postnasal drip  Neck: No JVD, no TMG, no carotid bruits  Lungs: No use of accessory muscles, no dullness to percussion, distant BS   Cardiovascular: RRR, heart sounds normal, no murmur or gallops, no peripheral edema  Abdomen: soft and NT, no HSM,  BS normal  Musculoskeletal: No deformities, no cyanosis or clubbing  Neuro: alert, non focal  Skin: Warm, no lesions or rashes       CXR 4/14:  Mild CHF, mild volume overload, COPD changes  Assessment & Plan:   COPD (chronic obstructive pulmonary disease) Copd golds III Plan No change in inhaled or maintenance medications. Return in   4 months      Updated Medication List Outpatient Encounter Prescriptions as of 11/03/2010  Medication Sig Dispense Refill  . albuterol (PROVENTIL HFA) 108 (90 BASE) MCG/ACT inhaler Inhale 2 puffs into the lungs every 6 (six) hours as needed.       Marland Kitchen atenolol (TENORMIN) 50 MG tablet Take 50 mg by mouth daily.        . furosemide (LASIX) 20 MG tablet Take 20 mg by mouth daily.        . indomethacin (INDOCIN) 50 MG capsule Take 50 mg by mouth 3 (three) times daily as needed.       Marland Kitchen losartan-hydrochlorothiazide (HYZAAR) 100-25 MG per tablet Take 1 tablet by mouth daily.        . metFORMIN (GLUMETZA) 500 MG (MOD) 24 hr tablet Take 500 mg by mouth 2 (two) times daily with a meal.        . omeprazole (PRILOSEC) 20 MG capsule Take 1 capsule (20 mg total) by mouth daily. Take 1/2 hour before mea then eat  90 capsule  4  . Tamsulosin HCl (FLOMAX) 0.4 MG CAPS Take 0.4 mg by mouth 2 (two) times daily.        Marland Kitchen tiotropium (SPIRIVA HANDIHALER) 18 MCG inhalation capsule Place 1 capsule (18 mcg total) into inhaler and inhale daily.  30 capsule  6  . DISCONTD: famotidine (PEPCID) 40 MG tablet Take 1 tablet (40 mg total) by mouth daily.  90 tablet  4  . DISCONTD: Fluticasone-Salmeterol (ADVAIR  DISKUS) 250-50 MCG/DOSE AEPB Inhale 1 puff into the lungs every 12 (twelve) hours.

## 2010-11-03 NOTE — Patient Instructions (Signed)
Stop pepcid  When current Advair runs out, stop Stay on spiriva Watch food intake A sleep study will be obtained Return 4 months

## 2010-11-03 NOTE — Assessment & Plan Note (Signed)
Copd golds III Plan No change in inhaled or maintenance medications. Return in   4 months

## 2010-12-12 ENCOUNTER — Ambulatory Visit (HOSPITAL_BASED_OUTPATIENT_CLINIC_OR_DEPARTMENT_OTHER): Payer: Medicare HMO | Attending: Critical Care Medicine

## 2010-12-12 DIAGNOSIS — G4736 Sleep related hypoventilation in conditions classified elsewhere: Secondary | ICD-10-CM

## 2010-12-12 DIAGNOSIS — J4489 Other specified chronic obstructive pulmonary disease: Secondary | ICD-10-CM | POA: Insufficient documentation

## 2010-12-12 DIAGNOSIS — J449 Chronic obstructive pulmonary disease, unspecified: Secondary | ICD-10-CM | POA: Insufficient documentation

## 2010-12-12 DIAGNOSIS — G4733 Obstructive sleep apnea (adult) (pediatric): Secondary | ICD-10-CM | POA: Insufficient documentation

## 2010-12-12 DIAGNOSIS — E119 Type 2 diabetes mellitus without complications: Secondary | ICD-10-CM | POA: Insufficient documentation

## 2010-12-12 DIAGNOSIS — I10 Essential (primary) hypertension: Secondary | ICD-10-CM | POA: Insufficient documentation

## 2010-12-31 DIAGNOSIS — G4710 Hypersomnia, unspecified: Secondary | ICD-10-CM

## 2010-12-31 DIAGNOSIS — G4730 Sleep apnea, unspecified: Secondary | ICD-10-CM

## 2010-12-31 NOTE — Procedures (Signed)
Philip Hatfield, Philip Hatfield NO.:  [id number]  MEDICAL RECORD NO.:  [id number]          PATIENT TYPE:  OUT  LOCATION:  SLEEP CENTER                 FACILITY:  [facility]  PHYSICIAN:  Oretha Milch, MD      DATE OF BIRTH:  20-Apr-1936  DATE OF STUDY:  12/12/2010                           NOCTURNAL POLYSOMNOGRAM  REFERRING PHYSICIAN:  Charlcie Cradle. Delford Field, MD, FCCP  INDICATION FOR STUDY:  Loud snoring, excessive daytime sleepiness, weight gain, choking episodes during sleep in this 75 year old gentleman with COPD and hypertension and diabetes.  At the time of this study, he weighed 285 pounds with a height of 5 feet 11 inches and a BMI of 40, neck size was 20 inches.  EPWORTH SLEEPINESS SCORE:  19.  MEDICATIONS:  None.  This intervention polysomnogram was performed with a sleep technologist in attendance.  EEG, EOG, EMG, EKG, and respiratory parameters were recorded.  Sleep stages arousals, limb movements, and respiratory data were scored according to criteria laid out by the American Academy of Sleep Medicine.  SLEEP ARCHITECTURE:  Lights out was at 10:48 p.m., lights on was at 5:41 a.m.  CPAP was initiated at 1:43 a.m.  During the diagnostic portion, total sleep time was 132 minutes with a sleep period time of 141 minutes, wake after sleep onset of 43 minutes.  Sleep latency of 33 minutes.  Sleep stages as a percentage of total sleep time was N1 of 9.5%, N2 of 73%, N3 of 1.5%, and REM sleep 16% (21 minutes).  Supine sleep accounted for 35 minutes.  During the CPAP portion, REM sleep was noted for 81 minutes; all of which was in the supine position.  Longest bit of REM sleep was around 2:30 a.m.  RESPIRATORY DATA:  During the diagnostic portion, there were 94 obstructive apneas, zero central apneas, 1 mixed apnea, and 62 hypopneas with an apnea/hypopnea index of 71 events per hour and the lowest desaturation of 70%.  Due to this degree of respiratory disturbance, CPAP was  initiated at 4 cm and titrated due to respiratory events and snoring to a final level of 12 cm, a level of 11 cm for 71 minutes including 28 minutes of REM supine sleep, 9 hypopneas were noted with a lowest desaturation of 86%, a final level of 12 cm for 40 minutes including 5 minutes of REM supine sleep, 1 central apnea, 6 hypopneas were noted with the lowest desaturation of 87%.  This appears to be the optimal level used during the study.  AROUSAL DATA:  During the diagnostic portion, there were 78 arousals with an arousal index of 35 events per hour.  Most of these were associated with respiratory events.  During titration portion, the arousal index was 12 events per hour.  OXYGEN DATA:  The lowest desaturation during the diagnostic portion was 70% with a desaturation index of 72 events per hour.  During the titration portion, the lowest desaturation was 87% on a CPAP of 12 cm and he spent 38 minutes with a saturation less than 88%.  CARDIAC DATA:  The low heart rate was 35 beats per minute and the high heart rate recorded was an  artifact.  No arrhythmias were noted.  MOVEMENT-PARASOMNIA:  No limb movements were noted during diagnostic portion.  These seemed to emerge during the titration portion with a PLM arousal index of 2 events per hour.  No significant limb movements were noted.  DISCUSSION:  This appears to be optimal titration since REM supine sleep was noted on the higher levels of CPAP.  He was desensitized with a large full-face mask.  IMPRESSIONS-RECOMMENDATIONS:  Impression: 1. Severe obstructive sleep apnea with hypopneas causing sleep     fragmentation and oxygen desaturation. 2. This was corrected by CPAP of 12 cm, a large full-face mask. 3. No evidence of significant limb movements, cardiac arrhythmias, or     behavioral disturbance during sleep.  Recommendations: 1. Treatment options for this degree of sleep disordered breathing     include weight loss and  CPAP therapy.  This CPAP can be initiated     at 12 cm with a large full-face mask and humidity and compliance     should be monitored at this level. 2. He should be cautioned against driving when sleepy and advised     against medications with sedative side effects.     Oretha Milch, MD Electronically Signed    RVA/MEDQ  D:  12/31/2010 14:01:23  T:  12/31/2010 21:43:09  Job:  [id number]  cc:   Charlcie Cradle. Delford Field, MD, FCCP [address] [place] [address]

## 2011-01-02 ENCOUNTER — Encounter: Payer: Self-pay | Admitting: Critical Care Medicine

## 2011-01-02 ENCOUNTER — Telehealth: Payer: Self-pay | Admitting: Critical Care Medicine

## 2011-01-02 ENCOUNTER — Other Ambulatory Visit: Payer: Self-pay | Admitting: Critical Care Medicine

## 2011-01-02 DIAGNOSIS — G4730 Sleep apnea, unspecified: Secondary | ICD-10-CM | POA: Insufficient documentation

## 2011-01-02 DIAGNOSIS — G4733 Obstructive sleep apnea (adult) (pediatric): Secondary | ICD-10-CM

## 2011-01-02 NOTE — Telephone Encounter (Signed)
I called pt, spouse answered and I told her cpap titration study pos for sleep apnea Pt needs f/u with dr Vassie Loll and I will proceed with cpap order

## 2011-02-05 ENCOUNTER — Telehealth: Payer: Self-pay | Admitting: *Deleted

## 2011-02-05 NOTE — Telephone Encounter (Signed)
Received fax from [facility] stating they have been unable to schedule pt's cpap set up.  They attached a letter to the fax and stated they were mailing this letter to the patient in a last attempt to reach him.    I called pt's home number and spoke with his wife.  Per wife, pt already uses o2 qhs and has not returned called because she states he was told something about this would be another monthly payment by [facility] and they cannot afford this at this time.  She is requesting I call and speak with pt directly on his cell phone number at [phone] regarding this and try to explain to him the bennifits of the machine.  I advised I will do this and will also try to contact [facility] regarding the expense on this.  I called pt's cell number but had to [email or web address].  I will also call [facility] to follow up on the expense of the machine for the pt.

## 2011-02-12 NOTE — Telephone Encounter (Signed)
Called home number [phone] - received message stating "the person you are trying to reach has exceeded the message capacity.  Please try your call again later."  Unable to leave message.  Called pt's cell number - [phone] - [email or web address]

## 2011-02-12 NOTE — Telephone Encounter (Signed)
Sharyn Creamer, spoke with Shanda Bumps.  Per Shanda Bumps, there will be a monthly charge for the cpap to patient of $11.86/mo but the first month it will be around $40-70 because of the cost of the mask, etc.  Shanda Bumps states they never mentioned to her that they could not afford this at this time or she could have offered financial assistance.  I asked Shanda Bumps to go ahead an mail this form to the patient and try to contact them regarding this.  Advised I would also call the pt to discuss this with them as well.  Per Shanda Bumps, once she received the financial form back with the needed documentation, it should not take any longer than 1 wk to find out if pt does qualify for this service.  I will try to contact pt again with this information to see if it is something he will be interested in.

## 2011-02-17 ENCOUNTER — Telehealth: Payer: Self-pay | Admitting: Critical Care Medicine

## 2011-02-17 NOTE — Telephone Encounter (Signed)
Spoke with pt.  He is aware [facility] has tried to contact him regarding setting up the cpap.  Pt states he is unable to set this up at this time because he cannot afford another monthly bill at this time.  I explained to pt that he would benefit from the cpap and even though he is using o2 qhs, they work differently to help him.  I advised pt that [facility] states they have financial assistance he could apply for to see if he qualifies.  Pt states he would like to set the cpap up if he qualifies for pt assistance.  States he has received something in the mail recently from [place].  He is in agreement to see if this is the financial forms.  He will fill this out and return to [place].  I also asked he contact [employer] regarding this to follow up.  He verbalized understanding of this and is in agreement with this plan to try to proceed with pt assistance in order to get cpap set up.  He will contact office if/if not he is approved for financial assistance.  I also called [employer] and spoke with Shanda Bumps who states she did mail the financial forms to the patient.  I advised her I did speak with him - he is interested in the cpap if he qualifies for assistance and states he will work on getting this taken care of.  I asked Shanda Bumps to pls follow up with the pt regarding this as well.  She verbalized understanding of this.  I will sign off on this as I have spoken with the pt and Shanda Bumps with [employer].  I will forward message to Dr. Delford Field as a FYI.

## 2011-02-17 NOTE — Telephone Encounter (Signed)
Noted  

## 2011-02-17 NOTE — Telephone Encounter (Signed)
Called, spoke with pt's daughter, Philip Hatfield.  She is requesting samples of spiriva for pt.  States he is working only part time right now and can't afford the rx.  2 samples placed at front Philip Hatfield aware.  She is also interested in the pt assistance program for spiriva for the pt.  Advised I will leave this form with the samples - pt will need to complete and bring back with the requested documents.  Philip Hatfield verbalized understanding of this.

## 2011-03-03 ENCOUNTER — Telehealth: Payer: Self-pay | Admitting: *Deleted

## 2011-03-03 NOTE — Telephone Encounter (Signed)
Message copied by Julaine Hua on Tue Mar 03, 2011  1:31 PM ------      Message from: Oretha Milch      Created: Tue Feb 24, 2011  7:15 PM      Regarding: RE: No room!!!       11/20 ok - welcome back !!            RA      ----- Message -----         From: Zackery Barefoot, CMA         Sent: 02/24/2011   8:50 AM           To: Comer Locket. Vassie Loll, MD      Subject: No room!!!                                               Good morning Dr. Vassie Loll! Your schedule has been depressing to accommodate the last few months. With that said please let me know were you would like this patient. Thank you. The first available you have in [place] is 11/20 and in [place] is 12/7 and I am sure both are too far out. Please advise.       ----- Message -----         From: Comer Locket. Vassie Loll, MD         Sent: 01/02/2011   8:31 AM           To: Zackery Barefoot, CMA, Shan Levans, MD            Intervention study - required CPAP 12 cm with large FF mask      JR, pl make FU OV  appt with me - post sleep study.            RA

## 2011-03-03 NOTE — Telephone Encounter (Signed)
Called and advised pt that PW and RA have discussed pt follow up with RA to discuss sleep study and treatment. Pt states if he "stops breathing while sleep I just stop breathing", and advised he does not "want to do anything about my sleep right now". I repeated back to pt, Dr. Delford Field would like for you to follow Dr. Vassie Loll about your sleep and pt states "I don't care what he says" and hung up. Will forward to PW and RA for review. Did offer pt appointment on 11/20 at 4:15pm in [place].

## 2011-03-03 NOTE — Telephone Encounter (Signed)
noted 

## 2011-05-14 ENCOUNTER — Encounter: Payer: Self-pay | Admitting: Gastroenterology

## 2011-06-09 ENCOUNTER — Other Ambulatory Visit: Payer: Self-pay | Admitting: Critical Care Medicine

## 2011-06-09 DIAGNOSIS — K219 Gastro-esophageal reflux disease without esophagitis: Secondary | ICD-10-CM

## 2011-06-09 MED ORDER — OMEPRAZOLE 20 MG PO CPDR: 20.0000 mg | DELAYED_RELEASE_CAPSULE | Freq: Every day | ORAL | Status: DC

## 2011-06-17 ENCOUNTER — Telehealth: Payer: Self-pay | Admitting: Critical Care Medicine

## 2011-06-17 NOTE — Telephone Encounter (Signed)
Spoke with daughter-appt made at 915am with PW in [place] office. Daughter aware if symptoms worsen through the night to get patient to closest ER.

## 2011-06-18 ENCOUNTER — Encounter: Payer: Self-pay | Admitting: Critical Care Medicine

## 2011-06-18 ENCOUNTER — Ambulatory Visit (INDEPENDENT_AMBULATORY_CARE_PROVIDER_SITE_OTHER): Payer: Medicare Other | Admitting: Critical Care Medicine

## 2011-06-18 VITALS — BP 186/84 | HR 46 | Temp 98.2°F | Ht 70.5 in | Wt 288.0 lb

## 2011-06-18 DIAGNOSIS — G4730 Sleep apnea, unspecified: Secondary | ICD-10-CM

## 2011-06-18 DIAGNOSIS — G4733 Obstructive sleep apnea (adult) (pediatric): Secondary | ICD-10-CM

## 2011-06-18 DIAGNOSIS — J449 Chronic obstructive pulmonary disease, unspecified: Secondary | ICD-10-CM

## 2011-06-18 MED ORDER — LOSARTAN POTASSIUM-HCTZ 100-25 MG PO TABS: 1.0000 | ORAL_TABLET | Freq: Every day | ORAL | Status: DC

## 2011-06-18 NOTE — Patient Instructions (Addendum)
Cpap will be set up ASAP with [employer] No change in  Medication A sleep consult with Dr Vassie Loll will be obtained Refill on BP med sent to [facility] for 20days Stay on spiriva Return 4 months

## 2011-06-18 NOTE — Progress Notes (Signed)
Subjective:    Patient ID: Philip Hatfield, male    DOB: 1935-12-03, 76 y.o.   MRN: [id number]  HPI 76 y.o.WM dx COPD 2010.  Quit smoking 2000.  Pt self referred.  See symptoms as above.  11/03/10 Seen first time 5/12 We rec: Start Spiriva daily  Stay on Advair  Resume pepcid and omeprazole daily  You should start Aspirin at least 81mg  daily   PFTs obtained as follows: PFT Conversion 09/09/2010  FVC 3.41  FVC PREDICT 4.47  FVC  % Predicted 76  FEV1 2.7  FEV1 PREDICT 2.97  FEV % Predicted 91  FEV1/FVC 79.2  FEV1/FVC PRE 68  FeF 25-75 2.69  FeF 25-75 % Predicted 103  Residual Volume (ml) 2.26  RV % EXPECT 85  DLCO (ml/mmHg sec) 19.8  DLCO % EXPEC 73  DLCO/VA 4.19  DLCO/VA%EXP 121   Since last ov doing well.  Better vs before.  ONO on RA abn.  No daytime hypersomnolence.   06/18/2011 Last seen 7/12: ONO showed desat and subsequent sleep study abn: severe sleep apnea.   Pt was rx with spiriva/advair.   Now:   Last few nights will wake up and is dyspneic.  No real cough.  Woke up gasping for air.  Notes more wheezing.  No chest pain, does feel tight.  HAs Barretts esophagitis.  Hiatal hernia.   No heartburn.  No prob with dysphagia now.   Notes some edema in feet.  The pt never got cpap  Due to insurance/payment issues   Past Medical History  Diagnosis Date  . Barrett's esophagus   . Diverticulosis   . Adenomatous colon polyp   . Emphysema   . COPD (chronic obstructive pulmonary disease)   . Diabetes mellitus   . Obesity   . Hypertension   . Hyperlipemia   . CAD (coronary artery disease)   . Gout   . Actinic keratosis      Family History  Problem Relation Age of Onset  . Prostate cancer Brother   . Prostate cancer Father   . Heart disease Mother   . Heart disease Father   . Lung cancer Brother      History   Social History  . Marital Status: Married    Spouse Name: N/A    Number of Children: 4  . Years of Education: N/A   Occupational History  .  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 Smoking status: Former Smoker -- 1.5 packs/day for 45 years    Types: Cigarettes    Quit date: 04/27/1998  . Smokeless tobacco: Never Used  . Alcohol Use: No  . Drug Use: No  . Sexually Active: Not on file   Other Topics Concern  . Not on file   Social History Narrative  . No narrative on file     No Known Allergies   Outpatient Prescriptions Prior to Visit  Medication Sig Dispense Refill  . atenolol (TENORMIN) 50 MG tablet Take 50 mg by mouth daily.        . furosemide (LASIX) 20 MG tablet Take 20 mg by mouth daily.        . metFORMIN (GLUMETZA) 500 MG (MOD) 24 hr tablet Take 500 mg by mouth 2 (two) times daily with a meal.        . omeprazole (PRILOSEC) 20 MG capsule Take 1 capsule (20 mg total)  by mouth daily. Take 1/2 hour before mea then eat  90 capsule  2  . Tamsulosin HCl (FLOMAX) 0.4 MG CAPS Take 0.4 mg by mouth daily.       Marland Kitchen tiotropium (SPIRIVA HANDIHALER) 18 MCG inhalation capsule Place 1 capsule (18 mcg total) into inhaler and inhale daily.  30 capsule  6  . albuterol (PROVENTIL HFA) 108 (90 BASE) MCG/ACT inhaler Inhale 2 puffs into the lungs every 6 (six) hours as needed.       . indomethacin (INDOCIN) 50 MG capsule Take 50 mg by mouth 3 (three) times daily as needed.       Marland Kitchen losartan-hydrochlorothiazide (HYZAAR) 100-25 MG per tablet Take 1 tablet by mouth daily.          ` Review of Systems  Constitutional: Positive for fatigue. Negative for chills, diaphoresis, activity change, appetite change and unexpected weight change.  HENT: Positive for hearing loss and trouble swallowing. Negative for nosebleeds, congestion, facial swelling, sneezing, mouth sores, neck stiffness, dental problem, voice change, postnasal drip, sinus pressure, tinnitus and ear discharge.   Eyes: Negative for photophobia, discharge, itching and visual disturbance.  Respiratory: Negative for apnea, cough,  choking, chest tightness and stridor.   Cardiovascular: Negative for palpitations.  Gastrointestinal: Positive for constipation. Negative for nausea, blood in stool and abdominal distention.  Genitourinary: Negative for dysuria, urgency, frequency, hematuria, flank pain, decreased urine volume and difficulty urinating.  Musculoskeletal: Negative for myalgias, back pain, joint swelling, arthralgias and gait problem.  Skin: Negative for color change and pallor.  Neurological: Positive for weakness. Negative for dizziness, tremors, seizures, syncope, speech difficulty, light-headedness and numbness.  Hematological: Negative for adenopathy. Bruises/bleeds easily.  Psychiatric/Behavioral: Positive for agitation. Negative for confusion and sleep disturbance. The patient is nervous/anxious.        Objective:   Physical Exam  Filed Vitals:   06/18/11 0922  BP: 186/84  Pulse: 46  Temp: 98.2 F (36.8 C)  TempSrc: Oral  Height: 5' 10.5" (1.791 m)  Weight: 288 lb (130.636 kg)  SpO2: 94%    ZOX:WRUEA WM  ENT: No lesions,  mouth clear,  oropharynx clear, no postnasal drip  Neck: No JVD, no TMG, no carotid bruits  Lungs: No use of accessory muscles, no dullness to percussion, distant BS   Cardiovascular: RRR, heart sounds normal, no murmur or gallops, no peripheral edema  Abdomen: soft and NT, no HSM,  BS normal  Musculoskeletal: No deformities, no cyanosis or clubbing  Neuro: alert, non focal  Skin: Warm, no lesions or rashes       CXR 4/14:  Mild CHF, mild volume overload, COPD changes Assessment & Plan:   Sleep apnea Severe Sleep apnea, the patient did not get CPap due to insurance / payment issues The pt and his daughter agree to obtain the cpap today Plan Rx Full face mask with cpap 12  Plus oxygen 2L Asap   COPD (chronic obstructive pulmonary disease) Copd severe Plan Continue current inhaled meds      Updated Medication List Outpatient Encounter  Prescriptions as of 06/18/2011  Medication Sig Dispense Refill  . atenolol (TENORMIN) 50 MG tablet Take 50 mg by mouth daily.        Marland Kitchen COLCRYS 0.6 MG tablet Take 1 tablet by mouth Daily.      . furosemide (LASIX) 20 MG tablet Take 20 mg by mouth daily.        . metFORMIN (GLUMETZA) 500 MG (MOD) 24 hr tablet Take 500 mg by  mouth 2 (two) times daily with a meal.        . omeprazole (PRILOSEC) 20 MG capsule Take 1 capsule (20 mg total) by mouth daily. Take 1/2 hour before mea then eat  90 capsule  2  . simvastatin (ZOCOR) 40 MG tablet Take 1 tablet by mouth Daily.      . Tamsulosin HCl (FLOMAX) 0.4 MG CAPS Take 0.4 mg by mouth daily.       Marland Kitchen tiotropium (SPIRIVA HANDIHALER) 18 MCG inhalation capsule Place 1 capsule (18 mcg total) into inhaler and inhale daily.  30 capsule  6  . albuterol (PROVENTIL HFA) 108 (90 BASE) MCG/ACT inhaler Inhale 2 puffs into the lungs every 6 (six) hours as needed.       Marland Kitchen losartan-hydrochlorothiazide (HYZAAR) 100-25 MG per tablet Take 1 tablet by mouth daily.  20 tablet  0  . DISCONTD: indomethacin (INDOCIN) 50 MG capsule Take 50 mg by mouth 3 (three) times daily as needed.       Marland Kitchen DISCONTD: losartan-hydrochlorothiazide (HYZAAR) 100-25 MG per tablet Take 1 tablet by mouth daily.

## 2011-06-18 NOTE — Assessment & Plan Note (Signed)
Severe Sleep apnea, the patient did not get CPap due to insurance / payment issues The pt and his daughter agree to obtain the cpap today Plan Rx Full face mask with cpap 12  Plus oxygen 2L Asap

## 2011-06-18 NOTE — Assessment & Plan Note (Signed)
Copd severe Plan Continue current inhaled meds

## 2011-07-06 ENCOUNTER — Encounter: Payer: Self-pay | Admitting: Gastroenterology

## 2011-07-06 ENCOUNTER — Ambulatory Visit (AMBULATORY_SURGERY_CENTER): Payer: Medicare Other | Admitting: *Deleted

## 2011-07-06 ENCOUNTER — Other Ambulatory Visit: Payer: Self-pay | Admitting: Critical Care Medicine

## 2011-07-06 ENCOUNTER — Telehealth: Payer: Self-pay | Admitting: Critical Care Medicine

## 2011-07-06 VITALS — Ht 71.0 in | Wt 280.0 lb

## 2011-07-06 DIAGNOSIS — Z1211 Encounter for screening for malignant neoplasm of colon: Secondary | ICD-10-CM

## 2011-07-06 MED ORDER — PEG-KCL-NACL-NASULF-NA ASC-C 100 G PO SOLR: ORAL | Status: DC

## 2011-07-06 NOTE — Telephone Encounter (Signed)
[email or web address].Tulsi Crossett, CMA  

## 2011-07-07 NOTE — Telephone Encounter (Signed)
Called and spoke with [name] from [facility] and informed her we received her phone call regarding pt getting set up on cpap.  Will close this message.

## 2011-07-13 ENCOUNTER — Encounter: Payer: Self-pay | Admitting: Gastroenterology

## 2011-07-13 ENCOUNTER — Ambulatory Visit (AMBULATORY_SURGERY_CENTER): Payer: Medicare Other | Admitting: Gastroenterology

## 2011-07-13 VITALS — BP 124/68 | HR 43 | Temp 95.8°F | Resp 25 | Ht 71.0 in | Wt 280.0 lb

## 2011-07-13 DIAGNOSIS — K2270 Barrett's esophagus without dysplasia: Secondary | ICD-10-CM

## 2011-07-13 MED ORDER — SODIUM CHLORIDE 0.9 % IV SOLN: 500.0000 mL | INTRAVENOUS | Status: DC

## 2011-07-13 NOTE — Op Note (Signed)
 [facility] [address] [place], [place]  [address]  ENDOSCOPY PROCEDURE REPORT  PATIENT:  Philip, Hatfield  MR#:  [id number] BIRTHDATE:  [birth year], 75 yrs. old  GENDER:  male  ENDOSCOPIST:  Barbette Hair. Arlyce Dice, MD Referred by:  Royanne Foots, M.D.  PROCEDURE DATE:  07/13/2011 PROCEDURE:  EGD with biopsy, 43239 ASA CLASS:  Class II INDICATIONS:  h/o Barrett's Esophagus  MEDICATIONS:   MAC sedation, administered by CRNA propofol 120mg IV, glycopyrrolate (Robinal) 0.2 mg IV, 0.6cc simethancone 0.6 cc PO TOPICAL ANESTHETIC:  DESCRIPTION OF PROCEDURE:   After the risks and benefits of the procedure were explained, informed consent was obtained.  The LB [id number] [id number] endoscope was introduced through the mouth and advanced to the third portion of the duodenum.  The instrument was slowly withdrawn as the mucosa was fully examined. <<PROCEDUREIMAGES>>  Barrett's esophagus was found at the gastroesophageal junction. Established Barrett's extending 2-3cm proximally. Multiple biopsies were obtained (see image4).  Otherwise the examination was normal (see image2 and image3).    Retroflexed views revealed no abnormalities.    The scope was then withdrawn from the patient and the procedure completed.  COMPLICATIONS:  None  ENDOSCOPIC IMPRESSION: 1) Barrett's esophagus at the gastroesophageal junction 2) Otherwise normal examination RECOMMENDATIONS: 1) Await biopsy results  ______________________________ Barbette Hair. Arlyce Dice, MD  CC:  n. eSIGNED:   Barbette Hair. Iceis Knab at 07/13/2011 09:45 AM  Rod Mae, [id number]

## 2011-07-13 NOTE — Patient Instructions (Signed)

## 2011-07-13 NOTE — Progress Notes (Signed)
Patient did not experience any of the following events: a burn prior to discharge; a fall within the facility; wrong site/side/patient/procedure/implant event; or a hospital transfer or hospital admission upon discharge from the facility. (G8907) Patient did not have preoperative order for IV antibiotic SSI prophylaxis. (G8918)  

## 2011-07-14 ENCOUNTER — Telehealth: Payer: Self-pay | Admitting: *Deleted

## 2011-07-14 NOTE — Telephone Encounter (Signed)
  Follow up Call-  Call back number 07/13/2011  Post procedure Call Back phone  # [phone]  Permission to leave phone message Yes     Patient questions:  Do you have a fever, pain , or abdominal swelling? no Pain Score  0 *  Have you tolerated food without any problems? yes  Have you been able to return to your normal activities? yes  Do you have any questions about your discharge instructions: Diet   no Medications  no Follow up visit  no  Do you have questions or concerns about your Care? no  Actions: * If pain score is 4 or above: No action needed, pain <4.

## 2011-07-16 ENCOUNTER — Encounter: Payer: Self-pay | Admitting: Gastroenterology

## 2011-07-21 ENCOUNTER — Encounter: Payer: Self-pay | Admitting: Pulmonary Disease

## 2011-07-21 ENCOUNTER — Ambulatory Visit (INDEPENDENT_AMBULATORY_CARE_PROVIDER_SITE_OTHER): Payer: Medicare Other | Admitting: Pulmonary Disease

## 2011-07-21 VITALS — BP 150/68 | HR 51 | Temp 98.0°F | Ht 70.5 in | Wt 286.0 lb

## 2011-07-21 DIAGNOSIS — G4730 Sleep apnea, unspecified: Secondary | ICD-10-CM

## 2011-07-21 NOTE — Patient Instructions (Signed)
You have severe obstructive sleep apnea  - you stopped breathing 70 times/ hour - this was corrected by CPAP We will try to get you nose prongs for your cpap machine Turn in the card before your next appt in 1 month

## 2011-07-21 NOTE — Progress Notes (Signed)
  Subjective:    Patient ID: Philip Hatfield, male    DOB: 02-12-1936, 76 y.o.   MRN: [id number]  HPI 76 y.o.WM  Ex smoker dx COPD 2010- referred for evaluation of obstructive sleep apnea   Quit smoking 2000. FEV1 nml Intervention study 8/12-baseline TST showed AHI 70/H,  required CPAP 12 cm with large FF mask to correct. No oxygen was required, lowest satn on CPAP 12 cm was 87%. He did not get started on CPAP until 2 weeks ago [facility]) Patient states using cpap 6-7 hours a night. Mask fits good. But he is having a hard time adjusting. His son , who also has oSA & drives a tractor trailer has nasal pillows & is helping him out. Bedtime 10-11pm, latency  , 3-4 awakenings, oob at 0500, gained 15 lbs in last 2 yrs. There is no history suggestive of cataplexy, sleep paralysis or parasomnias ESS 10/24 He has been on oxygen since 2010 Some nights, he rips off the mask, & puts oxygen on.    Review of Systems  Constitutional: Negative for fever and unexpected weight change.  HENT: Negative for ear pain, nosebleeds, congestion, sore throat, rhinorrhea, sneezing, trouble swallowing, dental problem, postnasal drip and sinus pressure.   Eyes: Negative for redness and itching.  Respiratory: Positive for shortness of breath and wheezing. Negative for cough and chest tightness.   Cardiovascular: Positive for leg swelling. Negative for palpitations.  Gastrointestinal: Negative for nausea and vomiting.  Genitourinary: Negative for dysuria.  Musculoskeletal: Negative for joint swelling.  Skin: Negative for rash.  Neurological: Negative for headaches.  Hematological: Does not bruise/bleed easily.  Psychiatric/Behavioral: Negative for dysphoric mood. The patient is not nervous/anxious.        Objective:   Physical Exam  Gen. Pleasant, obese, in no distress, normal affect ENT - no lesions, no post nasal drip, class 2-3 airway Neck: No JVD, no thyromegaly, no carotid bruits Lungs: no use of  accessory muscles, no dullness to percussion, decreased without rales or rhonchi  Cardiovascular: Rhythm regular, heart sounds  normal, no murmurs or gallops, no peripheral edema Abdomen: soft and non-tender, no hepatosplenomegaly, BS normal. Musculoskeletal: No deformities, no cyanosis or clubbing Neuro:  alert, non focal, no tremors       Assessment & Plan:

## 2011-07-21 NOTE — Assessment & Plan Note (Signed)
Given excessive daytime somnolence, narrow pharyngeal exam, witnessed apneas & loud snoring are c/w  Severe obstructive sleep apnea as confirmed by PSG . The pathophysiology of obstructive sleep apnea , it's cardiovascular consequences & modes of treatment including CPAP were discused with the patient in detail & they evidenced understanding. Weight loss encouraged, compliance with goal of at least 4-6 hrs every night is the expectation. Advised against medications with sedative side effects Cautioned against driving when sleepy - understanding that sleepiness will vary on a day to day basis  He would prefer nasal pillows interface - will request.  He does feel that with this information, he will be more compliant with CPAP. \Download in 4 wks

## 2011-07-30 ENCOUNTER — Encounter: Payer: Self-pay | Admitting: Critical Care Medicine

## 2011-09-08 ENCOUNTER — Ambulatory Visit: Payer: Medicare Other | Admitting: Pulmonary Disease

## 2011-12-14 ENCOUNTER — Ambulatory Visit: Payer: Medicare Other | Admitting: Critical Care Medicine

## 2012-01-14 ENCOUNTER — Ambulatory Visit: Payer: Medicare Other | Admitting: Critical Care Medicine

## 2012-02-08 ENCOUNTER — Ambulatory Visit: Payer: Medicare Other | Admitting: Critical Care Medicine

## 2012-02-29 ENCOUNTER — Ambulatory Visit: Payer: Medicare Other | Admitting: Critical Care Medicine

## 2013-09-20 ENCOUNTER — Encounter (HOSPITAL_COMMUNITY): Payer: Self-pay | Admitting: Emergency Medicine

## 2013-09-20 ENCOUNTER — Emergency Department (HOSPITAL_COMMUNITY): Payer: Medicare HMO

## 2013-09-20 ENCOUNTER — Emergency Department (HOSPITAL_COMMUNITY)
Admission: EM | Admit: 2013-09-20 | Discharge: 2013-09-20 | Disposition: A | Discharge status: Home / Self Care | Payer: Medicare HMO | Attending: Emergency Medicine | Admitting: Emergency Medicine

## 2013-09-20 DIAGNOSIS — M109 Gout, unspecified: Secondary | ICD-10-CM | POA: Insufficient documentation

## 2013-09-20 DIAGNOSIS — R079 Chest pain, unspecified: Secondary | ICD-10-CM

## 2013-09-20 DIAGNOSIS — IMO0002 Reserved for concepts with insufficient information to code with codable children: Secondary | ICD-10-CM | POA: Insufficient documentation

## 2013-09-20 DIAGNOSIS — I10 Essential (primary) hypertension: Secondary | ICD-10-CM | POA: Insufficient documentation

## 2013-09-20 DIAGNOSIS — R0789 Other chest pain: Secondary | ICD-10-CM | POA: Insufficient documentation

## 2013-09-20 DIAGNOSIS — Z8719 Personal history of other diseases of the digestive system: Secondary | ICD-10-CM | POA: Insufficient documentation

## 2013-09-20 DIAGNOSIS — Z87891 Personal history of nicotine dependence: Secondary | ICD-10-CM | POA: Insufficient documentation

## 2013-09-20 DIAGNOSIS — Z79899 Other long term (current) drug therapy: Secondary | ICD-10-CM | POA: Insufficient documentation

## 2013-09-20 DIAGNOSIS — E785 Hyperlipidemia, unspecified: Secondary | ICD-10-CM | POA: Insufficient documentation

## 2013-09-20 DIAGNOSIS — I2510 Atherosclerotic heart disease of native coronary artery without angina pectoris: Secondary | ICD-10-CM | POA: Insufficient documentation

## 2013-09-20 DIAGNOSIS — J449 Chronic obstructive pulmonary disease, unspecified: Secondary | ICD-10-CM | POA: Insufficient documentation

## 2013-09-20 DIAGNOSIS — Z872 Personal history of diseases of the skin and subcutaneous tissue: Secondary | ICD-10-CM | POA: Insufficient documentation

## 2013-09-20 DIAGNOSIS — E119 Type 2 diabetes mellitus without complications: Secondary | ICD-10-CM | POA: Insufficient documentation

## 2013-09-20 DIAGNOSIS — E669 Obesity, unspecified: Secondary | ICD-10-CM | POA: Insufficient documentation

## 2013-09-20 DIAGNOSIS — J4489 Other specified chronic obstructive pulmonary disease: Secondary | ICD-10-CM | POA: Insufficient documentation

## 2013-09-20 DIAGNOSIS — K219 Gastro-esophageal reflux disease without esophagitis: Secondary | ICD-10-CM | POA: Insufficient documentation

## 2013-09-20 DIAGNOSIS — Z8669 Personal history of other diseases of the nervous system and sense organs: Secondary | ICD-10-CM | POA: Insufficient documentation

## 2013-09-20 LAB — CBC
HEMATOCRIT: 43.1 % (ref 39.0–52.0)
Hemoglobin: 13.5 g/dL (ref 13.0–17.0)
MCH: 25.5 pg — ABNORMAL LOW (ref 26.0–34.0)
MCHC: 31.3 g/dL (ref 30.0–36.0)
MCV: 81.3 fL (ref 78.0–100.0)
Platelets: 357 10*3/uL (ref 150–400)
RBC: 5.3 MIL/uL (ref 4.22–5.81)
RDW: 16.5 % — ABNORMAL HIGH (ref 11.5–15.5)
WBC: 16.8 10*3/uL — ABNORMAL HIGH (ref 4.0–10.5)

## 2013-09-20 LAB — BASIC METABOLIC PANEL
BUN: 30 mg/dL — AB (ref 6–23)
CALCIUM: 9.4 mg/dL (ref 8.4–10.5)
CO2: 28 meq/L (ref 19–32)
Chloride: 96 mEq/L (ref 96–112)
Creatinine, Ser: 1.19 mg/dL (ref 0.50–1.35)
GFR calc Af Amer: 67 mL/min — ABNORMAL LOW (ref >90)
GFR, EST NON AFRICAN AMERICAN: 58 mL/min — AB (ref >90)
Glucose, Bld: 98 mg/dL (ref 70–99)
Potassium: 4 mEq/L (ref 3.7–5.3)
Sodium: 138 mEq/L (ref 137–147)

## 2013-09-20 LAB — I-STAT TROPONIN, ED
Troponin i, poc: 0.01 ng/mL (ref 0.00–0.08)
Troponin i, poc: 0.01 ng/mL (ref 0.00–0.08)

## 2013-09-20 LAB — PRO B NATRIURETIC PEPTIDE: PRO B NATRI PEPTIDE: 374.7 pg/mL (ref 0–450)

## 2013-09-20 MED ORDER — ASPIRIN 325 MG PO TABS
325.0000 mg | ORAL_TABLET | ORAL | Status: AC
Start: 2013-09-20 — End: 2013-09-20
  Administered 2013-09-20: 325 mg via ORAL
  Filled 2013-09-20: qty 1

## 2013-09-20 NOTE — ED Provider Notes (Signed)
CSN: [id number]     Arrival date & time 09/20/13  1455 History   First MD Initiated Contact with Patient 09/20/13 [phone]     Chief Complaint  Patient presents with  . Chest Pain     (Consider location/radiation/quality/duration/timing/severity/associated sxs/prior Treatment) The history is provided by the patient.  Philip Hatfield is a 77 y.o. male hx of barrett's esophagus, COPD, diabetes, CAD here with chest pain and shortness of breath. He was visiting his wife at a nursing home around 9:30 PM and had some headache and neck pain. Then had some substernal chest pressure. He went to bed and woke up around 2:30 AM and had some chest pain and shortness of breath. Pain radiate into left arm. Pain free now.    Past Medical History  Diagnosis Date  . Barrett's esophagus   . Diverticulosis   . Adenomatous colon polyp   . Emphysema   . COPD (chronic obstructive pulmonary disease)   . Diabetes mellitus   . Obesity   . Hypertension   . Hyperlipemia   . CAD (coronary artery disease)   . Gout   . Actinic keratosis   . GERD (gastroesophageal reflux disease)   . Hearing decreased    Past Surgical History  Procedure Laterality Date  . Testicle surgery  1998   Family History  Problem Relation Age of Onset  . Prostate cancer Brother   . Prostate cancer Father   . Heart disease Father   . Heart disease Mother   . Lung cancer Brother   . Colon cancer Neg Hx    History  Substance Use Topics  . Smoking status: Former Smoker -- 1.50 packs/day for 45 years    Types: Cigarettes    Quit date: 04/27/1998  . Smokeless tobacco: Never Used  . Alcohol Use: 4.2 oz/week    7 Cans of beer per week    Review of Systems  Respiratory: Positive for shortness of breath.   Cardiovascular: Positive for chest pain.  All other systems reviewed and are negative.     Allergies  Review of patient's allergies indicates no known allergies.  Home Medications   Prior to Admission medications    Medication Sig Start Date End Date Taking? Authorizing Provider  acetaminophen-codeine (TYLENOL #3) 300-30 MG per tablet  08/28/13   Historical Provider, MD  albuterol (PROVENTIL HFA) 108 (90 BASE) MCG/ACT inhaler Inhale 2 puffs into the lungs every 6 (six) hours as needed.     Historical Provider, MD  atenolol (TENORMIN) 50 MG tablet Take 50 mg by mouth daily.      Historical Provider, MD  COLCRYS 0.6 MG tablet Take 1 tablet by mouth Daily.    Historical Provider, MD  doxycycline (VIBRA-TABS) 100 MG tablet  09/13/13   Historical Provider, MD  fluticasone Asencion Islam) 50 MCG/ACT nasal spray  09/13/13   Historical Provider, MD  furosemide (LASIX) 20 MG tablet Take 20 mg by mouth daily.      Historical Provider, MD  indomethacin (INDOCIN) 50 MG capsule  07/10/13   Historical Provider, MD  ipratropium-albuterol (DUONEB) 0.5-2.5 (3) MG/3ML SOLN  09/13/13   Historical Provider, MD  losartan-hydrochlorothiazide (HYZAAR) 100-25 MG per tablet Take 1 tablet by mouth daily. 06/18/11   Elsie Stain, MD  metFORMIN (GLUMETZA) 500 MG (MOD) 24 hr tablet Take 500 mg by mouth 2 (two) times daily with a meal.      Historical Provider, MD  predniSONE (DELTASONE) 20 MG tablet  09/13/13   Historical  Provider, MD  promethazine-dextromethorphan (PROMETHAZINE-DM) 6.25-15 MG/5ML syrup  09/13/13   Historical Provider, MD  simvastatin (ZOCOR) 40 MG tablet Take 1 tablet by mouth Daily. 06/10/11   Historical Provider, MD  Tamsulosin HCl (FLOMAX) 0.4 MG CAPS Take 0.4 mg by mouth daily.     Historical Provider, MD  tiotropium (SPIRIVA HANDIHALER) 18 MCG inhalation capsule Place 1 capsule (18 mcg total) into inhaler and inhale daily. 08/28/10 08/28/11  Elsie Stain, MD   BP 137/62  Pulse 50  Temp(Src) 98.6 F (37 C) (Oral)  Resp 24  Ht 5\' 10"  (1.778 m)  Wt 290 lb (131.543 kg)  BMI 41.61 kg/m2  SpO2 94% Physical Exam  Nursing note and vitals reviewed. Constitutional: He is oriented to person, place, and time.  Chronically  ill, NAD   HENT:  Head: Normocephalic.  Mouth/Throat: Oropharynx is clear and moist.  Eyes: Conjunctivae and EOM are normal. Pupils are equal, round, and reactive to light.  Neck: Normal range of motion. Neck supple.  Cardiovascular: Normal rate, regular rhythm and normal heart sounds.   Pulmonary/Chest: Effort normal.  Mild diffuse wheezing (chronic per patient), not in distress   Abdominal: Soft. Bowel sounds are normal. He exhibits no distension. There is no tenderness. There is no rebound and no guarding.  Musculoskeletal:  1+ edema bilaterally (chronic)   Neurological: He is alert and oriented to person, place, and time.  Skin: Skin is warm and dry.  Psychiatric: He has a normal mood and affect. His behavior is normal. Judgment and thought content normal.    ED Course  Procedures (including critical care time) Labs Review Labs Reviewed  CBC - Abnormal; Notable for the following:    WBC 16.8 (*)    MCH 25.5 (*)    RDW 16.5 (*)    All other components within normal limits  BASIC METABOLIC PANEL - Abnormal; Notable for the following:    BUN 30 (*)    GFR calc non Af Amer 58 (*)    GFR calc Af Amer 67 (*)    All other components within normal limits  PRO B NATRIURETIC PEPTIDE  I-STAT TROPOININ, ED  I-STAT TROPOININ, ED    Imaging Review Dg Chest 2 View  09/20/2013   CLINICAL DATA:  Chest pain and chest tightness today. History of diabetes, hypertension and COPD.  EXAM: CHEST  2 VIEW  COMPARISON:  None.  FINDINGS: The heart is enlarged. Superior mediastinal widening is attributed to fat given the patient's body habitus. No signs of a mediastinal mass are identified. The lungs are mildly hyperinflated but clear. There is no pleural effusion or pneumothorax. Mild lower thoracic spine degenerative changes are noted.  IMPRESSION: Cardiomegaly and changes of chronic obstructive pulmonary disease. No acute cardiopulmonary process.   Electronically Signed   By: Camie Patience M.D.   On:  09/20/2013 17:04   Dg Cervical Spine Complete  09/20/2013   CLINICAL DATA:  neck pain  EXAM: CERVICAL SPINE  4+ VIEWS  COMPARISON:  None.  FINDINGS: Negative for fracture. 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 Anterior endplate spurring G4-0. Moderate narrowing of C4-5, and mild narrowing of C5-6 interspaces with anterior and posterior endplate spurs. No prevertebral soft tissue swelling. Uncovertebral spurs result in foraminal encroachment right C5-6, bilaterally C6-7. Patient is edentulous.  IMPRESSION: 1. Negative for fracture or other acute bone abnormality. 2. Multilevel spondylitic changes as above.   Electronically Signed   By: Arne Cleveland M.D.   On: 09/20/2013 17:07   Dg Wrist  Complete Left  09/20/2013   CLINICAL DATA:  L wrist pain  EXAM: LEFT WRIST - COMPLETE 3+ VIEW  COMPARISON:  08/28/2013  FINDINGS: There is no evidence of fracture or dislocation. The trapezium fracture suspected previously is less conspicuous. Carpal rows intact. There is no evidence of arthropathy or other focal bone abnormality. Soft tissues are unremarkable.  IMPRESSION: Negative.  Previously suspected trapezium fracture not visualized.   Electronically Signed   By: Arne Cleveland M.D.   On: 09/20/2013 17:05     EKG Interpretation   Date/Time:  Wednesday Sep 20 2013 18:22:51 EDT Ventricular Rate:  55 PR Interval:  178 QRS Duration: 98 QT Interval:  466 QTC Calculation: 446 R Axis:   46 Text Interpretation:  Sinus rhythm Abnormal R-wave progression, early  transition No significant change since last tracing Confirmed by Hung Rhinesmith  MD,  Hollis Oh ([id number]) on 09/20/2013 6:25:22 PM      MDM   Final diagnoses:  None   GARDINER ESPANA is a 78 y.o. male here with chest pain, SOB. Consider ACS and will need trop x 2. He states that his COPD is baseline and he always wheezes a little. I doubt PE. Will get labs, CXR and reassess.   6:28 PM Trop neg x 2. WBC 16 but on steroids. CXR showed no pneumonia. Never hypoxic. Had  L wrist pain and previous xray showed trapezium fracture but repeat xray showed no fracture. He has seen ortho already.    Wandra Arthurs, MD 09/20/13 [phone]

## 2013-09-20 NOTE — ED Notes (Addendum)
Pt c/o central CP that started last night around 930pm. sts it started with a HA then his chest started hurting. Then around 0230 am the chest pain became worse and he started to feel sob then the pain started to radiate into left arm. Nad, skin warm and dry, resp e/u.

## 2013-09-20 NOTE — Discharge Instructions (Signed)
Follow up with your doctor.   You may need repeat stress test.   Take tylenol or motrin for pain.   Return to ER if you have severe pain, shortness of breath.

## 2013-09-20 NOTE — ED Notes (Signed)
Pt stable. Pt given discharge instructions. Pt understood discharge instructions. No questions identified by pt or family. Pt accompanied by family. Pt discharged by wheelchair.

## 2013-09-20 NOTE — ED Notes (Signed)
Dr. Yao at bedside. 

## 2013-10-10 ENCOUNTER — Encounter: Payer: Self-pay | Admitting: *Deleted

## 2013-10-19 ENCOUNTER — Encounter: Payer: Self-pay | Admitting: Gastroenterology

## 2014-07-06 ENCOUNTER — Encounter: Payer: Self-pay | Admitting: Gastroenterology

## 2014-10-03 ENCOUNTER — Encounter: Payer: Self-pay | Admitting: Gastroenterology

## 2014-12-02 ENCOUNTER — Emergency Department (HOSPITAL_COMMUNITY)
Admission: EM | Admit: 2014-12-02 | Discharge: 2014-12-02 | Disposition: A | Discharge status: Home / Self Care | Payer: Medicare HMO | Attending: Emergency Medicine | Admitting: Emergency Medicine

## 2014-12-02 ENCOUNTER — Encounter (HOSPITAL_COMMUNITY): Payer: Self-pay | Admitting: *Deleted

## 2014-12-02 DIAGNOSIS — I2510 Atherosclerotic heart disease of native coronary artery without angina pectoris: Secondary | ICD-10-CM | POA: Insufficient documentation

## 2014-12-02 DIAGNOSIS — J449 Chronic obstructive pulmonary disease, unspecified: Secondary | ICD-10-CM | POA: Diagnosis not present

## 2014-12-02 DIAGNOSIS — M109 Gout, unspecified: Secondary | ICD-10-CM | POA: Diagnosis not present

## 2014-12-02 DIAGNOSIS — Z87891 Personal history of nicotine dependence: Secondary | ICD-10-CM | POA: Insufficient documentation

## 2014-12-02 DIAGNOSIS — E119 Type 2 diabetes mellitus without complications: Secondary | ICD-10-CM | POA: Diagnosis not present

## 2014-12-02 DIAGNOSIS — H9190 Unspecified hearing loss, unspecified ear: Secondary | ICD-10-CM | POA: Insufficient documentation

## 2014-12-02 DIAGNOSIS — E669 Obesity, unspecified: Secondary | ICD-10-CM | POA: Insufficient documentation

## 2014-12-02 DIAGNOSIS — Z7982 Long term (current) use of aspirin: Secondary | ICD-10-CM | POA: Diagnosis not present

## 2014-12-02 DIAGNOSIS — Z872 Personal history of diseases of the skin and subcutaneous tissue: Secondary | ICD-10-CM | POA: Diagnosis not present

## 2014-12-02 DIAGNOSIS — G629 Polyneuropathy, unspecified: Secondary | ICD-10-CM | POA: Diagnosis not present

## 2014-12-02 DIAGNOSIS — K219 Gastro-esophageal reflux disease without esophagitis: Secondary | ICD-10-CM | POA: Insufficient documentation

## 2014-12-02 DIAGNOSIS — G5793 Unspecified mononeuropathy of bilateral lower limbs: Secondary | ICD-10-CM

## 2014-12-02 DIAGNOSIS — Z79899 Other long term (current) drug therapy: Secondary | ICD-10-CM | POA: Insufficient documentation

## 2014-12-02 DIAGNOSIS — I10 Essential (primary) hypertension: Secondary | ICD-10-CM | POA: Diagnosis not present

## 2014-12-02 DIAGNOSIS — R600 Localized edema: Secondary | ICD-10-CM | POA: Insufficient documentation

## 2014-12-02 DIAGNOSIS — Z86010 Personal history of colonic polyps: Secondary | ICD-10-CM | POA: Diagnosis not present

## 2014-12-02 DIAGNOSIS — Z7951 Long term (current) use of inhaled steroids: Secondary | ICD-10-CM | POA: Insufficient documentation

## 2014-12-02 DIAGNOSIS — M79671 Pain in right foot: Secondary | ICD-10-CM | POA: Diagnosis present

## 2014-12-02 LAB — CBG MONITORING, ED: GLUCOSE-CAPILLARY: 103 mg/dL — AB (ref 65–99)

## 2014-12-02 MED ORDER — GABAPENTIN 300 MG PO CAPS: 300.0000 mg | ORAL_CAPSULE | Freq: Every day | ORAL | Status: DC

## 2014-12-02 MED ORDER — PREDNISONE 50 MG PO TABS: ORAL_TABLET | ORAL | Status: DC

## 2014-12-02 MED ORDER — METHYLPREDNISOLONE ACETATE 80 MG/ML IJ SUSP
80.0000 mg | Freq: Once | INTRAMUSCULAR | Status: AC
  Administered 2014-12-02: 80 mg via INTRAMUSCULAR
  Filled 2014-12-02: qty 1

## 2014-12-02 MED ORDER — HYDROCODONE-ACETAMINOPHEN 5-325 MG PO TABS
1.0000 | ORAL_TABLET | Freq: Once | ORAL | Status: AC
  Administered 2014-12-02: 1 via ORAL
  Filled 2014-12-02: qty 1

## 2014-12-02 NOTE — ED Provider Notes (Addendum)
CSN: [id number]     Arrival date & time 12/02/14  1437 History   First MD Initiated Contact with Patient 12/02/14 1506     Chief Complaint  Patient presents with  . Foot Pain     (Consider location/radiation/quality/duration/timing/severity/associated sxs/prior Treatment) HPI  He is a 79 year old man here for evaluation of foot pain. He has a history of COPD, diabetes, hypertension, heart disease, gout. He states he has pain in his right little toe that is consistent with prior gout attacks. This started last week. He was also changed from indomethacin for gout to allopurinol around this time. Pain has worsened since he's been on the allopurinol. He also describes a burning pain in the balls of both of his feet. Blood sugars are well-controlled at home around 100.  Past Medical History  Diagnosis Date  . Barrett's esophagus   . Diverticulosis   . Adenomatous colon polyp     2005  . Emphysema   . COPD (chronic obstructive pulmonary disease)   . Diabetes mellitus   . Obesity   . Hypertension   . Hyperlipemia   . CAD (coronary artery disease)   . Gout   . Actinic keratosis   . GERD (gastroesophageal reflux disease)   . Hearing decreased    Past Surgical History  Procedure Laterality Date  . Testicle surgery  1998   Family History  Problem Relation Age of Onset  . Prostate cancer Brother   . Prostate cancer Father   . Heart disease Father   . Heart disease Mother   . Lung cancer Brother   . Colon cancer Neg Hx    History  Substance Use Topics  . Smoking status: Former Smoker -- 1.50 packs/day for 45 years    Types: Cigarettes    Quit date: 04/27/1998  . Smokeless tobacco: Never Used  . Alcohol Use: 4.2 oz/week    7 Cans of beer per week    Review of Systems  As in history of present illness  Allergies  Review of patient's allergies indicates no known allergies.  Home Medications   Prior to Admission medications   Medication Sig Start Date End Date Taking?  Authorizing Provider  albuterol (PROVENTIL HFA) 108 (90 BASE) MCG/ACT inhaler Inhale 2 puffs into the lungs every 6 (six) hours as needed for wheezing.     Historical Provider, MD  aspirin 325 MG tablet Take 325 mg by mouth daily.    Historical Provider, MD  atenolol (TENORMIN) 50 MG tablet Take 50 mg by mouth daily.      Historical Provider, MD  doxycycline (VIBRA-TABS) 100 MG tablet Take 100 mg by mouth 2 (two) times daily.  09/13/13   Historical Provider, MD  fluticasone (FLONASE) 50 MCG/ACT nasal spray Place 1 spray into both nostrils daily.  09/13/13   Historical Provider, MD  furosemide (LASIX) 20 MG tablet Take 20 mg by mouth daily as needed for fluid.     Historical Provider, MD  gabapentin (NEURONTIN) 300 MG capsule Take 1 capsule (300 mg total) by mouth at bedtime. 12/02/14   Melony Overly, MD  indomethacin (INDOCIN) 50 MG capsule Take 50 mg by mouth 3 (three) times daily as needed (gout).  07/10/13   Historical Provider, MD  ipratropium-albuterol (DUONEB) 0.5-2.5 (3) MG/3ML SOLN Inhale 3 mLs into the lungs every 6 (six) hours as needed (wheezing).  09/13/13   Historical Provider, MD  losartan-hydrochlorothiazide (HYZAAR) 100-25 MG per tablet Take 1 tablet by mouth daily.  Historical Provider, MD  metFORMIN (GLUMETZA) 500 MG (MOD) 24 hr tablet Take 500 mg by mouth 2 (two) times daily with a meal.      Historical Provider, MD  omeprazole (PRILOSEC) 20 MG capsule Take 20 mg by mouth 2 (two) times daily before a meal.    Historical Provider, MD  predniSONE (DELTASONE) 50 MG tablet Start on 8/8.  Take 1 pill daily for 5 days. 12/02/14   Melony Overly, MD  promethazine-dextromethorphan (PROMETHAZINE-DM) 6.25-15 MG/5ML syrup Take 5 mLs by mouth 4 (four) times daily as needed for cough.  09/13/13   Historical Provider, MD  tamsulosin (FLOMAX) 0.4 MG CAPS capsule Take 0.4 mg by mouth 2 (two) times daily.    Historical Provider, MD   BP 144/91 mmHg  Pulse 68  Temp(Src) 98.7 F (37.1 C) (Oral)  Resp 18   SpO2 95% Physical Exam  Constitutional: He is oriented to person, place, and time. He appears well-developed and well-nourished. No distress.  Cardiovascular: Normal rate.   Pulmonary/Chest: Effort normal.  Musculoskeletal: He exhibits edema (trace pitting edema bilaterally).  Right foot: There is erythema, swelling Thomas and warmth at the fifth MTP joint. Pain to light touch as well as movement of the fifth digit. 1+ DP pulse. No specific tenderness on the plantar foot, but he describes pain in the balls of his foot. Left foot: 1+ DP pulse. No specific tenderness.  Neurological: He is alert and oriented to person, place, and time.    ED Course  Procedures (including critical care time) Labs Review Labs Reviewed  CBG MONITORING, ED - Abnormal; Notable for the following:    Glucose-Capillary 103 (*)    All other components within normal limits    Imaging Review No results found.   EKG Interpretation None      MDM   Final diagnoses:  Acute gout of right foot, unspecified cause  Neuropathic pain of both feet    Norco 5-325 milligrams tablet given for pain. Depo-Medrol 80 mg IM given for gout. Blood sugar good at 103. Treat gout with prednisone for 5 days. He can stay on the allopurinol. Trial gabapentin at bedtime for likely neuropathic pain of feet. Follow-up with PCP in 1-2 weeks.    Melony Overly, MD 12/02/14 [place], MD 12/02/14 1539

## 2014-12-02 NOTE — Discharge Instructions (Signed)
You have a gout flare in your right little toe. Take prednisone daily for 5 days, starting tomorrow. Continue the allopurinol.    The burning pain in your feet is likely due to neuropathy. Take gabapentin at bedtime. This medicine will make you drowsy.  Please follow-up with your primary care doctor in the next 1-2 weeks.

## 2014-12-02 NOTE — ED Notes (Signed)
Declined W/C at D/C and was escorted to lobby by RN. 

## 2014-12-02 NOTE — ED Notes (Signed)
Pt reports hx of gout, recently had medications changed and now having severe bilateral foot pain and swelling.

## 2014-12-05 ENCOUNTER — Encounter: Payer: Self-pay | Admitting: Gastroenterology

## 2015-10-07 ENCOUNTER — Encounter: Payer: Self-pay | Admitting: Gastroenterology

## 2015-11-01 ENCOUNTER — Telehealth: Payer: Self-pay | Admitting: *Deleted

## 2015-11-01 NOTE — Telephone Encounter (Signed)
PV and colonoscopy cancelled.  OV with Dr Loletha Carrow scheduled for 9/20 at 2:00.  Scheduled with pt's daughter

## 2015-11-01 NOTE — Telephone Encounter (Signed)
Office visit, please.

## 2015-11-01 NOTE — Telephone Encounter (Signed)
Dr Loletha Carrow,  Pt is due for recall EGD for barrett's esophagus and recall colon was due 2015 per recall assessment sheet per Dr Deatra Ina.  Pt is on oxygen for COPD.  Do you want OV instead of scheduling directly to hospital?  Is it okay to schedule EGD and colon at same time if ok for direct hospital?  Thanks, Juliann Pulse in [facility]

## 2015-11-26 ENCOUNTER — Encounter: Payer: Medicare HMO | Admitting: Gastroenterology

## 2016-01-15 ENCOUNTER — Ambulatory Visit (INDEPENDENT_AMBULATORY_CARE_PROVIDER_SITE_OTHER)
Admission: RE | Admit: 2016-01-15 | Discharge: 2016-01-15 | Disposition: A | Discharge status: Home / Self Care | Payer: Medicare HMO | Source: Ambulatory Visit | Attending: Gastroenterology | Admitting: Gastroenterology

## 2016-01-15 ENCOUNTER — Encounter: Payer: Self-pay | Admitting: Gastroenterology

## 2016-01-15 ENCOUNTER — Ambulatory Visit (INDEPENDENT_AMBULATORY_CARE_PROVIDER_SITE_OTHER): Payer: Medicare HMO | Admitting: Gastroenterology

## 2016-01-15 VITALS — BP 140/70 | HR 47 | Ht 70.5 in | Wt 289.0 lb

## 2016-01-15 DIAGNOSIS — R0600 Dyspnea, unspecified: Secondary | ICD-10-CM | POA: Diagnosis not present

## 2016-01-15 DIAGNOSIS — K2270 Barrett's esophagus without dysplasia: Secondary | ICD-10-CM

## 2016-01-15 DIAGNOSIS — R059 Cough, unspecified: Secondary | ICD-10-CM

## 2016-01-15 DIAGNOSIS — R05 Cough: Secondary | ICD-10-CM | POA: Diagnosis not present

## 2016-01-15 DIAGNOSIS — J449 Chronic obstructive pulmonary disease, unspecified: Secondary | ICD-10-CM

## 2016-01-15 NOTE — Patient Instructions (Signed)
If you are age 78 or older, your body mass index should be between 23-30. Your Body mass index is 40.88 kg/m. If this is out of the aforementioned range listed, please consider follow up with your Primary Care Provider.  If you are age 28 or younger, your body mass index should be between 19-25. Your Body mass index is 40.88 kg/m. If this is out of the aformentioned range listed, please consider follow up with your Primary Care Provider.   Dr Camillo Flaming will see you tomorrow at 2 pm at the [facility] office location   [address]   Thank you for choosing [facility]  Dr Wilfrid Lund III

## 2016-01-15 NOTE — Progress Notes (Signed)
[facility] Consult Note:  History: Philip Hatfield 01/15/2016  Referring physician: Myrtis Hopping, MD  Reason for consult/chief complaint: recall Colonoscopy/Endoscopy (No GI Complaints, concerns about stomach being distended)   Subjective  HPI:  Here for Barrett's esophagus follow-up. It appears to have been diagnosed in 2005 by Dr. Deatra Hatfield. It is short segment Barrett's of 2-3 cm in length, and there has never been dysplasia. His last upper endoscopy was in March 2013. His last colonoscopy had no polyps in August 2008. His main complaint today is worsening dyspnea and cough. He reports having vacation to the beach recently and ate heavily while he was there. He believes he has gained 12 pounds just in a week. I do not know if he has any history of congestive heart failure, as his primary care is outside of her health system. He normally wears oxygen at night, but says he does not typically needed during the day. He complains of a nonproductive cough and denies fever. Philip Hatfield denies dysphagia, odynophagia, nausea or vomiting, early satiety or weight loss. He denies altered bowel habits or rectal bleeding. ROS:  Review of Systems  Constitutional: Negative for appetite change and unexpected weight change.  HENT: Negative for mouth sores and voice change.   Eyes: Negative for pain and redness.  Respiratory: Positive for cough and shortness of breath.   Cardiovascular: Positive for leg swelling. Negative for chest pain and palpitations.  Genitourinary: Negative for dysuria and hematuria.  Musculoskeletal: Negative for arthralgias and myalgias.  Skin: Negative for pallor and rash.  Neurological: Negative for weakness and headaches.  Hematological: Negative for adenopathy.     Past Medical History: Past Medical History:  Diagnosis Date  . Actinic keratosis   . Adenomatous colon polyp    2005  . Barrett's esophagus   . CAD (coronary artery disease)   . COPD (chronic  obstructive pulmonary disease) ([place])   . Diabetes mellitus   . Diverticulosis   . Emphysema   . GERD (gastroesophageal reflux disease)   . Gout   . Hearing decreased   . Hyperlipemia   . Hypertension   . Obesity      Past Surgical History: Past Surgical History:  Procedure Laterality Date  . TESTICLE SURGERY  1998     Family History: Family History  Problem Relation Age of Onset  . Prostate cancer Brother   . Prostate cancer Father   . Heart disease Father   . Heart disease Mother   . Lung cancer Brother   . Colon cancer Neg Hx     Social History: Social History   Social History  . Marital status: Married    Spouse name: N/A  . Number of children: 4  . Years of education: N/A   Occupational History  . retired     Administrator  . Driver     part time    Social History Main Topics  . Smoking status: Former Smoker    Packs/day: 1.50    Years: 45.00    Types: Cigarettes    Quit date: 04/27/1998  . Smokeless tobacco: Never Used  . Alcohol use 4.2 oz/week    7 Cans of beer per week  . Drug use: No  . Sexual activity: Not Asked   Other Topics Concern  . None   Social History Narrative  . None    Allergies: No Known Allergies  Outpatient Meds: Current Outpatient Prescriptions  Medication Sig Dispense Refill  . albuterol (PROVENTIL HFA) 108 (90  BASE) MCG/ACT inhaler Inhale 2 puffs into the lungs every 6 (six) hours as needed for wheezing.     Marland Kitchen aspirin 325 MG tablet Take 325 mg by mouth daily.    Marland Kitchen atenolol (TENORMIN) 50 MG tablet Take 50 mg by mouth daily.      . fluticasone (FLONASE) 50 MCG/ACT nasal spray Place 1 spray into both nostrils daily.     . furosemide (LASIX) 20 MG tablet Take 20 mg by mouth daily as needed for fluid.     Marland Kitchen gabapentin (NEURONTIN) 300 MG capsule Take 1 capsule (300 mg total) by mouth at bedtime. 30 capsule 0  . indomethacin (INDOCIN) 50 MG capsule Take 50 mg by mouth 3 (three) times daily as needed (gout).     Marland Kitchen  ipratropium-albuterol (DUONEB) 0.5-2.5 (3) MG/3ML SOLN Inhale 3 mLs into the lungs every 6 (six) hours as needed (wheezing).     Marland Kitchen losartan-hydrochlorothiazide (HYZAAR) 100-25 MG per tablet Take 1 tablet by mouth daily.    . metFORMIN (GLUMETZA) 500 MG (MOD) 24 hr tablet Take 500 mg by mouth 2 (two) times daily with a meal.      . omeprazole (PRILOSEC) 20 MG capsule Take 20 mg by mouth 2 (two) times daily before a meal.    . tamsulosin (FLOMAX) 0.4 MG CAPS capsule Take 0.4 mg by mouth 2 (two) times daily.     No current facility-administered medications for this visit.       ___________________________________________________________________ Objective   Exam:  BP 140/70 (BP Location: Left Arm, Patient Position: Sitting, Cuff Size: Large)   Pulse (!) 47   Ht 5' 10.5" (1.791 m)   Wt 289 lb (131.1 kg)   SpO2 91%   BMI 40.88 kg/m    General: this is a(n) Chronically ill-appearing elderly man.   Eyes: sclera anicteric, no redness  ENT: oral mucosa moist without lesions, no cervical or supraclavicular lymphadenopathy, good dentition  CV: RRR without murmur, S1/S2, no JVD, 3+ pretibial edema bilaterally  Resp: Mildly dyspneic at rest, he has faint his laboratory crackles in the right middle and bilateral lower lung fields, respiratory rate 20. He is not using accessory muscles to breathe and is in no distress. He is able to speak full sentences.  GI: soft, morbidly obese, no tenderness, with active bowel sounds. No guarding or palpable organomegaly noted.  Skin; warm and dry, no rash or jaundice noted  Neuro: awake, alert and oriented x 3. Normal gross motor function and fluent speech   Assessment: Encounter Diagnoses  Name Primary?  . Barrett's esophagus without dysplasia   . Dyspnea Yes  . Cough   . Chronic obstructive pulmonary disease, unspecified COPD type ([place])     Long-standing Barrett's esophagus without dysplasia. His current respiratory status makes him high risk  for's endoscopic sedation and therefore I do not think it is wise. He seems to be having either a COPD exacerbation or perhaps pulmonary edema He does not appear in need of emergency room evaluation. We have contacted his primary care physician, and gotten him an appointment for tomorrow. We can indicated that staff and confirmed that they have x-ray facilities, so we do not need to send him down for one today. I've instructed him to use his oxygen as soon as he returns to his car after today's visit.  Plan:  I will see him back in several months and we can decide whether or not it is wise to proceed with an upper endoscopy.  Thank  you for the courtesy of this consult.  Please call me with any questions or concerns.  Nelida Meuse III  CC: Philip Hopping, MD

## 2016-03-30 ENCOUNTER — Emergency Department (HOSPITAL_COMMUNITY): Payer: Medicare HMO

## 2016-03-30 ENCOUNTER — Emergency Department (HOSPITAL_COMMUNITY)
Admission: EM | Admit: 2016-03-30 | Discharge: 2016-03-31 | Disposition: A | Discharge status: Home / Self Care | Payer: Medicare HMO | Attending: Emergency Medicine | Admitting: Emergency Medicine

## 2016-03-30 ENCOUNTER — Encounter (HOSPITAL_COMMUNITY): Payer: Self-pay | Admitting: Emergency Medicine

## 2016-03-30 DIAGNOSIS — Z7982 Long term (current) use of aspirin: Secondary | ICD-10-CM | POA: Diagnosis not present

## 2016-03-30 DIAGNOSIS — I10 Essential (primary) hypertension: Secondary | ICD-10-CM | POA: Diagnosis not present

## 2016-03-30 DIAGNOSIS — W19XXXA Unspecified fall, initial encounter: Secondary | ICD-10-CM

## 2016-03-30 DIAGNOSIS — Y92009 Unspecified place in unspecified non-institutional (private) residence as the place of occurrence of the external cause: Secondary | ICD-10-CM | POA: Insufficient documentation

## 2016-03-30 DIAGNOSIS — J449 Chronic obstructive pulmonary disease, unspecified: Secondary | ICD-10-CM | POA: Diagnosis not present

## 2016-03-30 DIAGNOSIS — E119 Type 2 diabetes mellitus without complications: Secondary | ICD-10-CM | POA: Diagnosis not present

## 2016-03-30 DIAGNOSIS — Z87891 Personal history of nicotine dependence: Secondary | ICD-10-CM | POA: Diagnosis not present

## 2016-03-30 DIAGNOSIS — R930 Abnormal findings on diagnostic imaging of skull and head, not elsewhere classified: Secondary | ICD-10-CM | POA: Diagnosis not present

## 2016-03-30 DIAGNOSIS — Y999 Unspecified external cause status: Secondary | ICD-10-CM | POA: Diagnosis not present

## 2016-03-30 DIAGNOSIS — Z79899 Other long term (current) drug therapy: Secondary | ICD-10-CM | POA: Diagnosis not present

## 2016-03-30 DIAGNOSIS — N3000 Acute cystitis without hematuria: Secondary | ICD-10-CM

## 2016-03-30 DIAGNOSIS — Y939 Activity, unspecified: Secondary | ICD-10-CM | POA: Insufficient documentation

## 2016-03-30 DIAGNOSIS — R300 Dysuria: Secondary | ICD-10-CM | POA: Diagnosis present

## 2016-03-30 DIAGNOSIS — W06XXXA Fall from bed, initial encounter: Secondary | ICD-10-CM | POA: Diagnosis not present

## 2016-03-30 DIAGNOSIS — I2510 Atherosclerotic heart disease of native coronary artery without angina pectoris: Secondary | ICD-10-CM | POA: Insufficient documentation

## 2016-03-30 LAB — CBC WITH DIFFERENTIAL/PLATELET
BASOS ABS: 0 10*3/uL (ref 0.0–0.1)
Basophils Relative: 0 %
EOS ABS: 0 10*3/uL (ref 0.0–0.7)
EOS PCT: 0 %
HCT: 36 % — ABNORMAL LOW (ref 39.0–52.0)
HEMOGLOBIN: 11.3 g/dL — AB (ref 13.0–17.0)
LYMPHS ABS: 1.9 10*3/uL (ref 0.7–4.0)
LYMPHS PCT: 10 %
MCH: 22.8 pg — AB (ref 26.0–34.0)
MCHC: 31.4 g/dL (ref 30.0–36.0)
MCV: 72.7 fL — ABNORMAL LOW (ref 78.0–100.0)
Monocytes Absolute: 1.7 10*3/uL — ABNORMAL HIGH (ref 0.1–1.0)
Monocytes Relative: 9 %
NEUTROS PCT: 81 %
Neutro Abs: 14.5 10*3/uL — ABNORMAL HIGH (ref 1.7–7.7)
PLATELETS: 296 10*3/uL (ref 150–400)
RBC: 4.95 MIL/uL (ref 4.22–5.81)
RDW: 17.8 % — ABNORMAL HIGH (ref 11.5–15.5)
WBC: 18.1 10*3/uL — AB (ref 4.0–10.5)

## 2016-03-30 LAB — I-STAT CG4 LACTIC ACID, ED: LACTIC ACID, VENOUS: 1.37 mmol/L (ref 0.5–1.9)

## 2016-03-30 MED ORDER — IPRATROPIUM-ALBUTEROL 0.5-2.5 (3) MG/3ML IN SOLN
3.0000 mL | Freq: Once | RESPIRATORY_TRACT | Status: AC
  Administered 2016-03-31: 3 mL via RESPIRATORY_TRACT
  Filled 2016-03-30: qty 3

## 2016-03-30 NOTE — ED Notes (Signed)
Respiratory called to bedside.

## 2016-03-30 NOTE — ED Triage Notes (Signed)
Patient fell at home landing on his buttocks. Patient complaining of general weakness, fever, burning with urination, and an odor to urine.

## 2016-03-30 NOTE — ED Notes (Signed)
Bed: [id number] Expected date:  Expected time:  Means of arrival:  Comments: 80 yo M/ Fever

## 2016-03-30 NOTE — ED Provider Notes (Signed)
[facility] [place] DEPT Provider Note   CSN: [id number] Arrival date & time: 03/30/16  2314  By signing my name below, I, Julien Nordmann, attest that this documentation has been prepared under the direction and in the presence of Deano Tomaszewski, MD.  Electronically Signed: Julien Nordmann, ED Scribe. 03/31/16. 2:12 AM.    History   Chief Complaint Chief Complaint  Patient presents with  . Fall    The history is provided by the patient. No language interpreter was used.  Fall  This is a new problem. The current episode started 3 to 5 hours ago. The problem occurs rarely. The problem has not changed since onset.Pertinent negatives include no chest pain, no abdominal pain, no headaches and no shortness of breath. Nothing aggravates the symptoms. Nothing relieves the symptoms. He has tried nothing for the symptoms. The treatment provided no relief.   HPI Comments: Philip Hatfield is a 80 y.o. male brought in by ambulance, who has a PMhx of actinic keratosis, COPD with PRN home O2 and nebs which he uses at best once a day, DM, GERD, HLD, HTN presents to the Emergency Department s/p a fall that occurred PTA. Pt states that he slid off of the bed and fell on the floor, landing on his buttocks. He did not hit his head or lose consciousness. Pt lives with his son. He further complains of chronic left elbow pain secondary to gout. Denies increased pain to the joint post fall and did not land on the left side.  He simply  Family believes pt may have a UTI which may have caused his fall.  He has had dysuria and funny smelling urine. Pt is a former smoker and quit about 16 years ago.  No CP no n/v/d.  Denies focal weakness/numbness no changes in mentation vision or speech or cognition.    Past Medical History:  Diagnosis Date  . Actinic keratosis   . Adenomatous colon polyp    2005  . Barrett's esophagus   . CAD (coronary artery disease)   . COPD (chronic obstructive pulmonary disease) ([place])   . Diabetes  mellitus   . Diverticulosis   . Emphysema   . GERD (gastroesophageal reflux disease)   . Gout   . Hearing decreased   . Hyperlipemia   . Hypertension   . Obesity     Patient Active Problem List   Diagnosis Date Noted  . Sleep apnea 01/02/2011  . Emphysema   . COPD (chronic obstructive pulmonary disease) ([place])   . Hyperlipemia   . CAD (coronary artery disease)   . DIABETES MELLITUS-TYPE II 06/17/2009  . HYPERTENSION 06/17/2009  . GERD 06/17/2009  . BARRETT'S ESOPHAGUS 06/17/2009  . DIVERTICULOSIS-COLON 06/17/2009  . DYSPHAGIA 06/17/2009  . PERSONAL HX COLONIC POLYPS 06/17/2009    Past Surgical History:  Procedure Laterality Date  . TESTICLE SURGERY  1998       Home Medications    Prior to Admission medications   Medication Sig Start Date End Date Taking? Authorizing Provider  albuterol (PROVENTIL HFA) 108 (90 BASE) MCG/ACT inhaler Inhale 2 puffs into the lungs every 6 (six) hours as needed for wheezing.     Historical Provider, MD  aspirin 325 MG tablet Take 325 mg by mouth daily.    Historical Provider, MD  atenolol (TENORMIN) 50 MG tablet Take 50 mg by mouth daily.      Historical Provider, MD  fluticasone (FLONASE) 50 MCG/ACT nasal spray Place 1 spray into both nostrils daily.  09/13/13  Historical Provider, MD  furosemide (LASIX) 20 MG tablet Take 20 mg by mouth daily as needed for fluid.     Historical Provider, MD  gabapentin (NEURONTIN) 300 MG capsule Take 1 capsule (300 mg total) by mouth at bedtime. 12/02/14   Melony Overly, MD  indomethacin (INDOCIN) 50 MG capsule Take 50 mg by mouth 3 (three) times daily as needed (gout).  07/10/13   Historical Provider, MD  ipratropium-albuterol (DUONEB) 0.5-2.5 (3) MG/3ML SOLN Inhale 3 mLs into the lungs every 6 (six) hours as needed (wheezing).  09/13/13   Historical Provider, MD  losartan-hydrochlorothiazide (HYZAAR) 100-25 MG per tablet Take 1 tablet by mouth daily.    Historical Provider, MD  metFORMIN (GLUMETZA) 500 MG  (MOD) 24 hr tablet Take 500 mg by mouth 2 (two) times daily with a meal.      Historical Provider, MD  omeprazole (PRILOSEC) 20 MG capsule Take 20 mg by mouth 2 (two) times daily before a meal.    Historical Provider, MD  tamsulosin (FLOMAX) 0.4 MG CAPS capsule Take 0.4 mg by mouth 2 (two) times daily.    Historical Provider, MD    Family History Family History  Problem Relation Age of Onset  . Prostate cancer Brother   . Prostate cancer Father   . Heart disease Father   . Heart disease Mother   . Lung cancer Brother   . Colon cancer Neg Hx     Social History Social History  Substance Use Topics  . Smoking status: Former Smoker    Packs/day: 1.50    Years: 45.00    Types: Cigarettes    Quit date: 04/27/1998  . Smokeless tobacco: Never Used  . Alcohol use 4.2 oz/week    7 Cans of beer per week     Allergies   Patient has no known allergies.   Review of Systems Review of Systems  Constitutional: Negative for chills, diaphoresis and fever.  Respiratory: Negative for cough, chest tightness and shortness of breath.   Cardiovascular: Negative for chest pain, palpitations and leg swelling.  Gastrointestinal: Negative for abdominal pain and vomiting.  Genitourinary: Positive for dysuria. Negative for flank pain.  Musculoskeletal: Negative for gait problem, neck pain and neck stiffness.  Neurological: Negative for dizziness, syncope, facial asymmetry, weakness, numbness and headaches.  Psychiatric/Behavioral: Negative for confusion and decreased concentration.  All other systems reviewed and are negative.    Physical Exam Updated Vital Signs BP 148/97 (BP Location: Right Arm)   Pulse 70   Temp 98.2 F (36.8 C) (Oral)   Resp 18   SpO2 98%   Physical Exam  Constitutional: He is oriented to person, place, and time. He appears well-developed and well-nourished.  HENT:  Head: Normocephalic and atraumatic.  Mouth/Throat: Oropharynx is clear and moist. No oropharyngeal  exudate.  No battle signs or raccoon eyes, no hemotympanum on the left or right  Eyes: Conjunctivae and EOM are normal. Pupils are equal, round, and reactive to light.  Eye implants after the year 2001  Neck: Normal range of motion. Neck supple. No JVD present. No tracheal deviation present.  No carotid bruits. Trachea midline.   Cardiovascular: Normal rate, regular rhythm, normal heart sounds and intact distal pulses.  Exam reveals no gallop and no friction rub.   No murmur heard. RRR.   Pulmonary/Chest: Effort normal. No stridor. No respiratory distress. He has no wheezes. He has no rales. He exhibits no tenderness.  Lungs diminished   Abdominal: Soft. Bowel sounds are  normal. He exhibits no distension and no mass. There is no tenderness. There is no rebound and no guarding.  Musculoskeletal: Normal range of motion. He exhibits no edema, tenderness or deformity.       Left elbow: He exhibits normal range of motion, no swelling, no effusion, no deformity and no laceration. No tenderness found. No radial head, no medial epicondyle, no lateral epicondyle and no olecranon process tenderness noted.       Left wrist: Normal.       Right hip: Normal.       Left hip: Normal.       Right knee: Normal.       Left knee: Normal.       Right ankle: Normal. Achilles tendon normal.       Left ankle: Normal. Achilles tendon normal.       Cervical back: Normal.       Thoracic back: Normal.       Lumbar back: Normal.       Left upper arm: Normal.  Intact dorsalis pedis bilaterally.  Intact pronation and supination of the LUE.  Normal flexion and extension of the elbow.    Lymphadenopathy:    He has no cervical adenopathy.  Neurological: He is alert and oriented to person, place, and time. He has normal reflexes. He displays normal reflexes. No cranial nerve deficit or sensory deficit. He exhibits normal muscle tone.  Skin: Skin is warm and dry. Capillary refill takes less than 2 seconds.  Psychiatric:  He has a normal mood and affect.  Nursing note and vitals reviewed.    ED Treatments / Results   Vitals:   03/31/16 0117 03/31/16 0302  BP: 139/90 (!) 162/101  Pulse: 74 70  Resp: 23 13  Temp:  98.1 F (36.7 C)    DIAGNOSTIC STUDIES: Oxygen Saturation is 98% on RA, normal by my interpretation.  COORDINATION OF CARE:  11:32 PM Discussed treatment plan with pt at bedside and pt agreed to plan.  Results for orders placed or performed during the hospital encounter of 03/30/16  Urinalysis, Routine w reflex microscopic- may I&O cath if menses  Result Value Ref Range   Color, Urine AMBER (A) YELLOW   APPearance CLEAR CLEAR   Specific Gravity, Urine 1.024 1.005 - 1.030   pH 7.0 5.0 - 8.0   Glucose, UA NEGATIVE NEGATIVE mg/dL   Hgb urine dipstick SMALL (A) NEGATIVE   Bilirubin Urine SMALL (A) NEGATIVE   Ketones, ur NEGATIVE NEGATIVE mg/dL   Protein, ur 100 (A) NEGATIVE mg/dL   Nitrite NEGATIVE NEGATIVE   Leukocytes, UA NEGATIVE NEGATIVE  Comprehensive metabolic panel  Result Value Ref Range   Sodium 135 135 - 145 mmol/L   Potassium 4.0 3.5 - 5.1 mmol/L   Chloride 104 101 - 111 mmol/L   CO2 20 (L) 22 - 32 mmol/L   Glucose, Bld 137 (H) 65 - 99 mg/dL   BUN 29 (H) 6 - 20 mg/dL   Creatinine, Ser 1.26 (H) 0.61 - 1.24 mg/dL   Calcium 9.0 8.9 - 10.3 mg/dL   Total Protein 7.6 6.5 - 8.1 g/dL   Albumin 3.5 3.5 - 5.0 g/dL   AST 19 15 - 41 U/L   ALT 17 17 - 63 U/L   Alkaline Phosphatase 68 38 - 126 U/L   Total Bilirubin 1.1 0.3 - 1.2 mg/dL   GFR calc non Af Amer 53 (L) >60 mL/min   GFR calc Af Amer >60 >60 mL/min  Anion gap 11 5 - 15  CBC with Differential  Result Value Ref Range   WBC 18.1 (H) 4.0 - 10.5 K/uL   RBC 4.95 4.22 - 5.81 MIL/uL   Hemoglobin 11.3 (L) 13.0 - 17.0 g/dL   HCT 36.0 (L) 39.0 - 52.0 %   MCV 72.7 (L) 78.0 - 100.0 fL   MCH 22.8 (L) 26.0 - 34.0 pg   MCHC 31.4 30.0 - 36.0 g/dL   RDW 17.8 (H) 11.5 - 15.5 %   Platelets 296 150 - 400 K/uL   Neutrophils  Relative % 81 %   Neutro Abs 14.5 (H) 1.7 - 7.7 K/uL   Lymphocytes Relative 10 %   Lymphs Abs 1.9 0.7 - 4.0 K/uL   Monocytes Relative 9 %   Monocytes Absolute 1.7 (H) 0.1 - 1.0 K/uL   Eosinophils Relative 0 %   Eosinophils Absolute 0.0 0.0 - 0.7 K/uL   Basophils Relative 0 %   Basophils Absolute 0.0 0.0 - 0.1 K/uL  Urine microscopic-add on  Result Value Ref Range   Squamous Epithelial / LPF NONE SEEN NONE SEEN   WBC, UA 0-5 0 - 5 WBC/hpf   RBC / HPF 0-5 0 - 5 RBC/hpf   Bacteria, UA RARE (A) NONE SEEN   Urine-Other MUCOUS PRESENT   I-Stat CG4 Lactic Acid, ED  Result Value Ref Range   Lactic Acid, Venous 1.37 0.5 - 1.9 mmol/L   Dg Chest 2 View  Result Date: 03/31/2016 CLINICAL DATA:  Weakness, shortness of breath. Found on floor by family. EXAM: CHEST  2 VIEW COMPARISON:  Radiograph 01/16/2016 FINDINGS: Unchanged cardiomegaly and mediastinal contours. No evidence pulmonary edema, pleural fluid or pneumothorax. No focal airspace disease. No acute osseous abnormality. IMPRESSION: Chronic cardiomegaly. No evidence of congestive failure or acute abnormality. Electronically Signed   By: Jeb Levering M.D.   On: 03/31/2016 00:31   Ct Head Wo Contrast  Result Date: 03/31/2016 CLINICAL DATA:  Fall off bed onto floor. EXAM: CT HEAD WITHOUT CONTRAST CT CERVICAL SPINE WITHOUT CONTRAST TECHNIQUE: Multidetector CT imaging of the head and cervical spine was performed following the standard protocol without intravenous contrast. Multiplanar CT image reconstructions of the cervical spine were also generated. COMPARISON:  None. FINDINGS: CT HEAD FINDINGS Brain: Mild generalized cerebral and cerebellar atrophy. No intracranial hemorrhage, evidence of acute ischemia, mass effect or midline shift. No subdural or extra-axial fluid collection. Vascular: Atherosclerosis of skullbase vasculature. No hyperdense vessel. Skull: Normal. Negative for fracture or focal lesion. Sinuses/Orbits: No acute finding. Other:  None. CT CERVICAL SPINE FINDINGS Alignment: Straightening of normal lordosis. Trace anterolisthesis of C3 on C4. Trace anterolisthesis of C7 on T1. Skull base and vertebrae: No acute fracture. Well-defined lucency noted in the left aspect of the base of the odontoid with well-defined margins. Soft tissues and spinal canal: No spinal canal hematoma. Disc levels: There is ossification of the posterior longitudinal ligament from C4 through C6. Calcifications posteriorly about the C6-C7 disc space may be calcified disc osteophyte complex or associated longitudinal ossifications. This ossification leads to narrowing of the spinal canal as well as bilateral neural foramina at these levels. There is diffuse disc space narrowing and endplate spurring. Probable segmentation anomaly at C2-C3. There is multilevel facet arthropathy. Upper chest: No acute abnormality. Other: None. IMPRESSION: 1.  No acute intracranial abnormality. 2. No evidence of acute fracture or subluxation of the cervical spine. 3. Multilevel degenerative change throughout cervical spine. Ossification of the posterior longitudinal ligament and degenerative change causes  spinal canal and neural foraminal narrowing from C4 through C7. 4. Lucencies at the base of the odontoid may be secondary to chronic osteoarthritis or sequela of inflammatory arthropathy such as gout. Electronically Signed   By: Jeb Levering M.D.   On: 03/31/2016 01:04   Ct Cervical Spine Wo Contrast  Result Date: 03/31/2016 CLINICAL DATA:  Fall off bed onto floor. EXAM: CT HEAD WITHOUT CONTRAST CT CERVICAL SPINE WITHOUT CONTRAST TECHNIQUE: Multidetector CT imaging of the head and cervical spine was performed following the standard protocol without intravenous contrast. Multiplanar CT image reconstructions of the cervical spine were also generated. COMPARISON:  None. FINDINGS: CT HEAD FINDINGS Brain: Mild generalized cerebral and cerebellar atrophy. No intracranial hemorrhage,  evidence of acute ischemia, mass effect or midline shift. No subdural or extra-axial fluid collection. Vascular: Atherosclerosis of skullbase vasculature. No hyperdense vessel. Skull: Normal. Negative for fracture or focal lesion. Sinuses/Orbits: No acute finding. Other: None. CT CERVICAL SPINE FINDINGS Alignment: Straightening of normal lordosis. Trace anterolisthesis of C3 on C4. Trace anterolisthesis of C7 on T1. Skull base and vertebrae: No acute fracture. Well-defined lucency noted in the left aspect of the base of the odontoid with well-defined margins. Soft tissues and spinal canal: No spinal canal hematoma. Disc levels: There is ossification of the posterior longitudinal ligament from C4 through C6. Calcifications posteriorly about the C6-C7 disc space may be calcified disc osteophyte complex or associated longitudinal ossifications. This ossification leads to narrowing of the spinal canal as well as bilateral neural foramina at these levels. There is diffuse disc space narrowing and endplate spurring. Probable segmentation anomaly at C2-C3. There is multilevel facet arthropathy. Upper chest: No acute abnormality. Other: None. IMPRESSION: 1.  No acute intracranial abnormality. 2. No evidence of acute fracture or subluxation of the cervical spine. 3. Multilevel degenerative change throughout cervical spine. Ossification of the posterior longitudinal ligament and degenerative change causes spinal canal and neural foraminal narrowing from C4 through C7. 4. Lucencies at the base of the odontoid may be secondary to chronic osteoarthritis or sequela of inflammatory arthropathy such as gout. Electronically Signed   By: Jeb Levering M.D.   On: 03/31/2016 01:04   Procedures Procedures (including critical care time)  Medications Ordered in ED  Medications  ipratropium-albuterol (DUONEB) 0.5-2.5 (3) MG/3ML nebulizer solution 3 mL (3 mLs Nebulization Given 03/31/16 0024)  sodium chloride 0.9 % bolus 500 mL  (0 mLs Intravenous Stopped 03/31/16 0259)  ketorolac (TORADOL) 30 MG/ML injection 30 mg (30 mg Intravenous Given 03/31/16 0258)  cephALEXin (KEFLEX) capsule 500 mg (500 mg Oral Given 03/31/16 0258)     Upon review patient has a chronically elevated WBC count back to 2015.  Moreover, patient is having an active gout flare which can explain an elevation, with this flare the patient has been on steroids which are also known to increase WBC count.  While urine is only minimally infected will treat given the patient's symptoms.  No rales on exam no PNA.  No signs of skin infection and no skin break down anywhere on the patient post fall. None of the patient's joints are red or hot.  Patient initially stated it was his elbow that hurt, there was no warmth no redness no bruising then stated it was his ankles that were having the gout flare.    2:13 AM After discussing with family, pt has home O2 and nebulizers at home that he is noncompliant with he reportedly uses them once a day if he feels like it. Will  send home with Keflex for mild UTI. Will follow up with pulmonologist and PMD/ rheumatologist this week for recurrent, chronic gout.  The family is interested in getting him long term suppressive medications for his gout.  EDP explained that this is.  And he is instructed to follow up this week with all his doctors to discuss his medication regimen.  New Prescriptions New Prescriptions   No medications on file  UTI: fall.  Patient was not wearing his O2 and I think that was more of a factor than this very minor UTI.  I appreciate triage note stating patient has had fevers at home but patient denies this and was fever free on arrival and had no fevers in the ED.  There are no neurologic abnormalities on exam and the patient is AO4.  I have advised using his nebulizer 4 times a day and following up and have given strict return precautions for focal weakness.  Changes in speech or mentation vomiting, fevers,  repeat fall or any concerns.  Patient and family verbalize understanding and agree to follow up.    All questions answered to patient's satisfaction. Based on history and exam patient has been appropriately medically screened and emergency conditions excluded. Patient is stable for discharge at this time. Follow up with your PMD for recheck in 2 days and strict return precautions given.   I personally performed the services described in this documentation, which was scribed in my presence. The recorded information has been reviewed and is accurate.      Veatrice Kells, MD 03/31/16 [id number]

## 2016-03-31 ENCOUNTER — Encounter (HOSPITAL_COMMUNITY): Payer: Self-pay | Admitting: Emergency Medicine

## 2016-03-31 LAB — COMPREHENSIVE METABOLIC PANEL
ALBUMIN: 3.5 g/dL (ref 3.5–5.0)
ALT: 17 U/L (ref 17–63)
ANION GAP: 11 (ref 5–15)
AST: 19 U/L (ref 15–41)
Alkaline Phosphatase: 68 U/L (ref 38–126)
BUN: 29 mg/dL — ABNORMAL HIGH (ref 6–20)
CO2: 20 mmol/L — AB (ref 22–32)
Calcium: 9 mg/dL (ref 8.9–10.3)
Chloride: 104 mmol/L (ref 101–111)
Creatinine, Ser: 1.26 mg/dL — ABNORMAL HIGH (ref 0.61–1.24)
GFR calc Af Amer: >60 mL/min (ref >60)
GFR calc non Af Amer: 53 mL/min — ABNORMAL LOW (ref >60)
GLUCOSE: 137 mg/dL — AB (ref 65–99)
POTASSIUM: 4 mmol/L (ref 3.5–5.1)
SODIUM: 135 mmol/L (ref 135–145)
TOTAL PROTEIN: 7.6 g/dL (ref 6.5–8.1)
Total Bilirubin: 1.1 mg/dL (ref 0.3–1.2)

## 2016-03-31 LAB — URINE MICROSCOPIC-ADD ON: Squamous Epithelial / LPF: NONE SEEN

## 2016-03-31 LAB — URINALYSIS, ROUTINE W REFLEX MICROSCOPIC
Glucose, UA: NEGATIVE mg/dL
Ketones, ur: NEGATIVE mg/dL
LEUKOCYTES UA: NEGATIVE
NITRITE: NEGATIVE
Protein, ur: 100 mg/dL — AB
SPECIFIC GRAVITY, URINE: 1.024 (ref 1.005–1.030)
pH: 7 (ref 5.0–8.0)

## 2016-03-31 MED ORDER — CEPHALEXIN 500 MG PO CAPS: 500.0000 mg | ORAL_CAPSULE | Freq: Four times a day (QID) | ORAL | 0 refills | Status: DC

## 2016-03-31 MED ORDER — SODIUM CHLORIDE 0.9 % IV BOLUS (SEPSIS)
500.0000 mL | Freq: Once | INTRAVENOUS | Status: AC
  Administered 2016-03-31: 500 mL via INTRAVENOUS

## 2016-03-31 MED ORDER — CEPHALEXIN 500 MG PO CAPS
500.0000 mg | ORAL_CAPSULE | Freq: Once | ORAL | Status: AC
  Administered 2016-03-31: 500 mg via ORAL
  Filled 2016-03-31: qty 1

## 2016-03-31 MED ORDER — KETOROLAC TROMETHAMINE 30 MG/ML IJ SOLN
30.0000 mg | Freq: Once | INTRAMUSCULAR | Status: AC
  Administered 2016-03-31: 30 mg via INTRAVENOUS
  Filled 2016-03-31: qty 1

## 2016-03-31 NOTE — ED Notes (Signed)
Pt ambulated in hallway with minimal assistance 

## 2016-04-27 HISTORY — PX: CATARACT EXTRACTION, BILATERAL: SHX1313

## 2017-06-16 ENCOUNTER — Ambulatory Visit (INDEPENDENT_AMBULATORY_CARE_PROVIDER_SITE_OTHER): Payer: Medicare HMO | Admitting: Internal Medicine

## 2017-06-16 ENCOUNTER — Encounter: Payer: Self-pay | Admitting: Internal Medicine

## 2017-06-16 VITALS — BP 140/78 | HR 112 | Temp 98.0°F | Resp 12 | Ht 71.0 in | Wt 230.0 lb

## 2017-06-16 DIAGNOSIS — R000 Tachycardia, unspecified: Secondary | ICD-10-CM | POA: Diagnosis not present

## 2017-06-16 DIAGNOSIS — R413 Other amnesia: Secondary | ICD-10-CM | POA: Diagnosis not present

## 2017-06-16 DIAGNOSIS — L989 Disorder of the skin and subcutaneous tissue, unspecified: Secondary | ICD-10-CM

## 2017-06-16 DIAGNOSIS — K219 Gastro-esophageal reflux disease without esophagitis: Secondary | ICD-10-CM | POA: Diagnosis not present

## 2017-06-16 DIAGNOSIS — Z23 Encounter for immunization: Secondary | ICD-10-CM

## 2017-06-16 DIAGNOSIS — E782 Mixed hyperlipidemia: Secondary | ICD-10-CM

## 2017-06-16 DIAGNOSIS — I10 Essential (primary) hypertension: Secondary | ICD-10-CM

## 2017-06-16 DIAGNOSIS — J439 Emphysema, unspecified: Secondary | ICD-10-CM | POA: Diagnosis not present

## 2017-06-16 DIAGNOSIS — E1140 Type 2 diabetes mellitus with diabetic neuropathy, unspecified: Secondary | ICD-10-CM

## 2017-06-16 DIAGNOSIS — M109 Gout, unspecified: Secondary | ICD-10-CM

## 2017-06-16 NOTE — Progress Notes (Signed)
Patient ID: Philip Hatfield, male   DOB: 07-13-1935, 82 y.o.   MRN: [id number]   Location:  [facility] OFFICE  Provider: DR Arletha Grippe  Code Status:  Goals of Care:  Advanced Directives 03/30/2016  Does Patient Have a Medical Advance Directive? No  Would patient like information on creating a medical advance directive? No - Patient declined     Chief Complaint  Patient presents with  . Establish Care    New Patient, establish care. Patient c/o arthritis and gout (treated by Dr.Gray at [place]). Here with daughter  . Weight Loss    Patient with weight loss and would like to review medications.   . Immunizations    Flu vaccine today   . Medication Management    Takes metforming off/on due to weight loss and concerned BS will drop too low     HPI: Patient is a 82 y.o. male seen today as a new pt. Previous PCP Dr Lajoyce Corners. He is HOH. He admits to short term memory loss. He has several scalp lesions that are pruritic. No bleeding. He has hx precancerous skin lesions  DM - stopped glumetza 1 week ago.  A1c 6.2% in 11/2015. He does not check BS at home consistently.  Gouty arthritis - uncontrolled. takes uloric, prednisone. Followed by rheum Dr Pearline Cables. Uric acid level 15.   BPH - takes flomax. No issues with urination. No nocturia. PSA 5.03 in   HTN - takes losartan hct and lasix. Stopped atenolol 1 week ago  COPD/emphysema/OSA - takes albuterol hfa; he does not use CPAP. Followed by pulm Dr Camillo Flaming. Last sleep study > 5 yrs ago  GERD/ hx Barrett's esophagus - takes omeprazole. He was seen by [facility] in the past. Last EGD in 2013.   Past Medical History:  Diagnosis Date  . Actinic keratosis   . Adenomatous colon polyp    2005  . Barrett's esophagus   . CAD (coronary artery disease)   . COPD (chronic obstructive pulmonary disease) ([place])   . Diabetes mellitus   . Diverticulosis   . Emphysema   . GERD (gastroesophageal reflux disease)   . Gout   . Hearing decreased   .  Hyperlipemia   . Hypertension   . Obesity     Past Surgical History:  Procedure Laterality Date  . [place]     reports that he quit smoking about 19 years ago. His smoking use included cigarettes. He quit after 45.00 years of use. he has never used smokeless tobacco. He reports that he does not drink alcohol or use drugs. Social History   Socioeconomic History  . Marital status: Widowed    Spouse name: Not on file  . Number of children: 4  . Years of education: Not on file  . Highest education level: Not on file  Social Needs  . Financial resource strain: Not on file  . Food insecurity - worry: Not on file  . Food insecurity - inability: Not on file  . Transportation needs - medical: Not on file  . Transportation needs - non-medical: Not on file  Occupational History  . Occupation: retired    Comment: Administrator  . Occupation: Geophysicist/field seismologist    Comment: part time   Tobacco Use  . Smoking status: Former Smoker    Years: 45.00    Types: Cigarettes    Last attempt to quit: 04/27/1998    Years since quitting: 19.1  . Smokeless tobacco: Never Used  .  Tobacco comment: Smoked 2-3 packs daily   Substance and Sexual Activity  . Alcohol use: No    Alcohol/week: 4.2 oz    Types: 7 Cans of beer per week    Frequency: Never  . Drug use: No  . Sexual activity: Not on file  Other Topics Concern  . 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 No   Do you have difficulty preparing food or eating? No   Do you  have difficulty managing your medications? No   Do you have difficulty managing your finances? No   Do you have difficulty affording your medications? Yes    Family History  Problem Relation Age of Onset  . Prostate cancer Brother   . Prostate cancer Father   . Heart disease Father   . Heart disease Mother   . Lung cancer Brother   . Colon cancer Neg Hx     No Known Allergies  Outpatient Encounter Medications as of 06/16/2017  Medication Sig  . albuterol (PROVENTIL HFA) 108 (90 BASE) MCG/ACT inhaler Inhale 2 puffs into the lungs every 6 (six) hours as needed for wheezing.   Marland Kitchen CHERRY CONCENTRATE PO Take 1,200 mg by mouth daily.  . febuxostat (ULORIC) 40 MG tablet Take 40 mg by mouth daily.  Marland Kitchen gabapentin (NEURONTIN) 300 MG capsule Take 300 mg by mouth 2 (two) times daily.  Marland Kitchen losartan-hydrochlorothiazide (HYZAAR) 100-25 MG per tablet Take 1 tablet by mouth daily.  Marland Kitchen omeprazole (PRILOSEC) 20 MG capsule Take 20 mg by mouth daily.   . predniSONE (DELTASONE) 5 MG tablet Take 5-10 mg by mouth daily with breakfast.  . tamsulosin (FLOMAX) 0.4 MG CAPS capsule Take 0.4 mg by mouth daily.   Marland Kitchen atenolol (TENORMIN) 50 MG tablet Take 25 mg by mouth daily.   . furosemide (LASIX) 20 MG tablet Take 20 mg by mouth daily as needed for fluid.   . metFORMIN (GLUMETZA) 500 MG (MOD) 24 hr tablet Take 500 mg by mouth 2 (two) times daily with a meal.    . [DISCONTINUED] gabapentin (NEURONTIN) 300 MG capsule Take 1 capsule (300 mg total) by mouth at bedtime. (Patient not taking: Reported on 06/16/2017)   No facility-administered encounter medications on file as of 06/16/2017.     Review of Systems:  Review of Systems  HENT: Positive for dental problem (wears dentures) and hearing loss (uses hearing aid).   Musculoskeletal: Positive for arthralgias.  Neurological: Positive for tremors.  All other systems reviewed and are negative. per NP packet  Health Maintenance  Topic Date Due  . HEMOGLOBIN A1C   31-Mar-1936  . FOOT EXAM  12/24/1947  . OPHTHALMOLOGY EXAM  12/24/1947  . INFLUENZA VACCINE  11/25/2016  . TETANUS/TDAP  08/18/2021  . PNA vac Low Risk Adult  Completed  Physical Exam: Vitals:   06/16/17 1107  BP: 140/78  Pulse: (!) 112  Resp: 12  Temp: 98 F (36.7 C)  TempSrc: Oral  SpO2: 91%  Weight: 230 lb (104.3 kg)  Height: '5\' 11"'$  (1.803 m)   Body mass index is 32.08 kg/m. Physical Exam  Constitutional: He appears well-developed and well-nourished.  HENT:  Mouth/Throat: Oropharynx is clear and moist.  MMM; no oral thrush  Eyes: Pupils are equal, round, and reactive to light. No scleral icterus.  Neck: Neck supple. Carotid bruit is not present. No thyromegaly present.  Cardiovascular: Normal rate, regular rhythm and intact distal pulses. Exam reveals no gallop and no friction rub.  Murmur (1/6 SEM) heard. +2 pitting LE edema b/l. No calf TTP  Pulmonary/Chest: Effort normal and breath sounds normal. He has no wheezes. He has no rales. He exhibits no tenderness.  Reduced BS at base b/l  Abdominal: Soft. Normal appearance and bowel sounds are normal. He exhibits no distension, no abdominal bruit, no pulsatile midline mass and no mass. There is no hepatomegaly. There is tenderness (LLQ). There is no rigidity, no rebound and no guarding. No hernia.  Musculoskeletal: He exhibits edema (small and large joint) and tenderness.  Lymphadenopathy:    He has no cervical adenopathy.  Neurological: He is alert.  Skin: Skin is warm and dry. No rash noted.  Scalp with multiple erythematous flat ulcerating and irregular shaped scalp lesions. No d/c. No secondary signs of infection  Psychiatric: He has a normal mood and affect. His behavior is normal. Thought content normal.   Diabetic Foot Exam - Simple   Simple Foot Form Diabetic Foot exam was performed with the following findings:  Yes 06/16/2017 11:49 AM  Visual Inspection See comments:  Yes Sensation Testing Intact to touch  and monofilament testing bilaterally:  Yes Pulse Check Posterior Tibialis and Dorsalis pulse intact bilaterally:  Yes Comments L>R bunion; dry cracking skin; no calluses or ulcerations     Labs reviewed: Basic Metabolic Panel: No results for input(s): NA, K, CL, CO2, GLUCOSE, BUN, CREATININE, CALCIUM, MG, PHOS, TSH in the last 8760 hours. Liver Function Tests: No results for input(s): AST, ALT, ALKPHOS, BILITOT, PROT, ALBUMIN in the last 8760 hours. No results for input(s): LIPASE, AMYLASE in the last 8760 hours. No results for input(s): AMMONIA in the last 8760 hours. CBC: No results for input(s): WBC, NEUTROABS, HGB, HCT, MCV, PLT in the last 8760 hours. Lipid Panel: No results for input(s): CHOL, HDL, LDLCALC, TRIG, CHOLHDL, LDLDIRECT in the last 8760 hours. No results found for: HGBA1C  Procedures since last visit: No results found. ECG OBTAINED AND REVIEWED BY MYSELF: baseline artifact with SR, nml axis, LAE, incomplete BBB, borderline LVH, PACs. No acute changes. No other ECG availlable to compare  Assessment/Plan   ICD-10-CM   1. Irregular tachycardia R00.0 EKG 12-Lead    CBC with Differential/Platelets  2. Type 2 diabetes mellitus with diabetic neuropathy, without long-term current use of insulin (HCC) E11.40 CMP with eGFR([facility])    Hemoglobin A1c    Microalbumin/Creatinine Ratio, Urine    Urinalysis with Reflex Microscopic  3. Gouty arthritis M10.9    uncontrolled  4. Essential hypertension I10 CBC with Differential/Platelets  5. Mixed hyperlipidemia E78.2 Lipid Panel    TSH  6. Pulmonary emphysema, unspecified emphysema type ([place]) J43.9   7. Gastroesophageal reflux disease without esophagitis K21.9   8. Memory loss R41.3 CMP with eGFR([facility])  9. Scalp lesion L98.9 Ambulatory referral to Dermatology  10. Need for  influenza vaccination Z23 Flu Vaccine QUAD 6+ mos PF IM (Fluarix Quad PF)   Will call with lab results  Will call with dermatology referral  Will  need local pulmonologist, sleep study  Continue current medications as ordered  Influenza vaccine given today   Follow up in 1 month for AWV/CPE/MMSE   Stephine Langbehn S. Perlie Gold  [facility] [address] [place], [place] [address] [phone] Cell (Monday-Friday 8 AM - 5 PM) [phone] After 5 PM and follow prompts

## 2017-06-16 NOTE — Patient Instructions (Addendum)
Will call with lab results  Will call with dermatology referral  Continue current medications as ordered  Influenza vaccine given today   Follow up in 1 month for AWV/CPE/MMSE

## 2017-06-17 ENCOUNTER — Other Ambulatory Visit: Payer: Self-pay

## 2017-06-17 ENCOUNTER — Other Ambulatory Visit: Payer: Self-pay | Admitting: Internal Medicine

## 2017-06-17 DIAGNOSIS — Z720 Tobacco use: Secondary | ICD-10-CM

## 2017-06-17 DIAGNOSIS — J439 Emphysema, unspecified: Secondary | ICD-10-CM

## 2017-06-17 DIAGNOSIS — D72829 Elevated white blood cell count, unspecified: Secondary | ICD-10-CM

## 2017-06-17 DIAGNOSIS — R634 Abnormal weight loss: Secondary | ICD-10-CM

## 2017-06-17 LAB — CBC WITH DIFFERENTIAL/PLATELET
BASOS PCT: 0.5 %
Basophils Absolute: 119 cells/uL (ref 0–200)
EOS PCT: 0.5 %
Eosinophils Absolute: 119 cells/uL (ref 15–500)
HCT: 35 % — ABNORMAL LOW (ref 38.5–50.0)
HEMOGLOBIN: 11.2 g/dL — AB (ref 13.2–17.1)
Lymphs Abs: 3342 cells/uL (ref 850–3900)
MCH: 24.6 pg — ABNORMAL LOW (ref 27.0–33.0)
MCHC: 32 g/dL (ref 32.0–36.0)
MCV: 76.9 fL — ABNORMAL LOW (ref 80.0–100.0)
MONOS PCT: 7.5 %
MPV: 10.5 fL (ref 7.5–12.5)
NEUTROS ABS: 18344 {cells}/uL — AB (ref 1500–7800)
Neutrophils Relative %: 77.4 %
Platelets: 475 10*3/uL — ABNORMAL HIGH (ref 140–400)
RBC: 4.55 10*6/uL (ref 4.20–5.80)
RDW: 17.5 % — ABNORMAL HIGH (ref 11.0–15.0)
Total Lymphocyte: 14.1 %
WBC mixed population: 1778 cells/uL — ABNORMAL HIGH (ref 200–950)
WBC: 23.7 10*3/uL — AB (ref 3.8–10.8)

## 2017-06-17 LAB — LIPID PANEL
CHOL/HDL RATIO: 3.1 (calc) (ref <5.0)
CHOLESTEROL: 130 mg/dL (ref <200)
HDL: 42 mg/dL (ref >40)
LDL CHOLESTEROL (CALC): 71 mg/dL
Non-HDL Cholesterol (Calc): 88 mg/dL (calc) (ref <130)
Triglycerides: 87 mg/dL (ref <150)

## 2017-06-17 LAB — URINALYSIS, ROUTINE W REFLEX MICROSCOPIC
Bacteria, UA: NONE SEEN /HPF
Bilirubin Urine: NEGATIVE
GLUCOSE, UA: NEGATIVE
HYALINE CAST: NONE SEEN /LPF
Hgb urine dipstick: NEGATIVE
Ketones, ur: NEGATIVE
Nitrite: NEGATIVE
Protein, ur: NEGATIVE
Specific Gravity, Urine: 1.023 (ref 1.001–1.03)
Squamous Epithelial / LPF: NONE SEEN /HPF (ref <=5)

## 2017-06-17 LAB — TSH: TSH: 1.32 mIU/L (ref 0.40–4.50)

## 2017-06-17 LAB — COMPLETE METABOLIC PANEL WITH GFR
AG Ratio: 1 (calc) (ref 1.0–2.5)
ALBUMIN MSPROF: 3.2 g/dL — AB (ref 3.6–5.1)
ALKALINE PHOSPHATASE (APISO): 83 U/L (ref 40–115)
ALT: 31 U/L (ref 9–46)
AST: 17 U/L (ref 10–35)
BUN / CREAT RATIO: 33 (calc) — AB (ref 6–22)
BUN: 37 mg/dL — AB (ref 7–25)
CO2: 25 mmol/L (ref 20–32)
CREATININE: 1.13 mg/dL (ref 0.70–1.18)
Calcium: 8.8 mg/dL (ref 8.6–10.3)
Chloride: 99 mmol/L (ref 98–110)
GFR, Est African American: 71 mL/min/{1.73_m2} (ref >=60)
GFR, Est Non African American: 61 mL/min/{1.73_m2} (ref >=60)
GLUCOSE: 103 mg/dL — AB (ref 65–99)
Globulin: 3.3 g/dL (calc) (ref 1.9–3.7)
Potassium: 5 mmol/L (ref 3.5–5.3)
Sodium: 135 mmol/L (ref 135–146)
Total Bilirubin: 0.5 mg/dL (ref 0.2–1.2)
Total Protein: 6.5 g/dL (ref 6.1–8.1)

## 2017-06-17 LAB — MICROALBUMIN / CREATININE URINE RATIO
CREATININE, URINE: 126 mg/dL (ref 20–320)
MICROALB/CREAT RATIO: 15 ug/mg{creat} (ref <30)
Microalb, Ur: 1.9 mg/dL

## 2017-06-17 LAB — HEMOGLOBIN A1C
EAG (MMOL/L): 7.3 (calc)
Hgb A1c MFr Bld: 6.2 % of total Hgb — ABNORMAL HIGH (ref <5.7)
Mean Plasma Glucose: 131 (calc)

## 2017-06-21 ENCOUNTER — Encounter: Payer: Self-pay | Admitting: Hematology and Oncology

## 2017-06-21 ENCOUNTER — Encounter: Payer: Self-pay | Admitting: Internal Medicine

## 2017-06-21 ENCOUNTER — Telehealth: Payer: Self-pay | Admitting: Hematology and Oncology

## 2017-06-21 MED ORDER — GABAPENTIN 300 MG PO CAPS: 300.0000 mg | ORAL_CAPSULE | Freq: Two times a day (BID) | ORAL | 0 refills | Status: AC

## 2017-06-21 NOTE — Telephone Encounter (Signed)
Appt has been scheduled with the pt's daughter to see a hematologist on 3/25 at 345pm. Aware to arrive 30 minutes early. Letter mailed.

## 2017-07-15 ENCOUNTER — Other Ambulatory Visit: Payer: Medicare HMO

## 2017-07-19 ENCOUNTER — Inpatient Hospital Stay: Payer: Medicare HMO | Attending: Hematology and Oncology | Admitting: Hematology and Oncology

## 2017-07-19 DIAGNOSIS — Z87891 Personal history of nicotine dependence: Secondary | ICD-10-CM | POA: Diagnosis not present

## 2017-07-19 DIAGNOSIS — E119 Type 2 diabetes mellitus without complications: Secondary | ICD-10-CM | POA: Diagnosis not present

## 2017-07-19 DIAGNOSIS — M1A9XX0 Chronic gout, unspecified, without tophus (tophi): Secondary | ICD-10-CM | POA: Insufficient documentation

## 2017-07-19 DIAGNOSIS — D72829 Elevated white blood cell count, unspecified: Secondary | ICD-10-CM | POA: Insufficient documentation

## 2017-07-19 DIAGNOSIS — I10 Essential (primary) hypertension: Secondary | ICD-10-CM | POA: Diagnosis not present

## 2017-07-19 DIAGNOSIS — D72825 Bandemia: Secondary | ICD-10-CM

## 2017-07-19 NOTE — Assessment & Plan Note (Signed)
Markedly elevated WBC count 23.7 on 06/17/2017 with an ANC of 18.3, hemoglobin 11.2, MCV 76.9, platelets 475  Differential diagnosis: I discussed with the patient that his elevation of white blood cell count is certainly related to underlying inflammation from his recurrent gout as well as medications especially from his prednisone therapy.  No additional blood testing is required or necessary at this time. I discussed with him that he has a baseline of 16-18K WBC and that he is likely to have exacerbations whenever he is either put on prednisone therapy or his gout gets inflamed.  We are happy to be on board if ever he requires hematology evaluation in the future.

## 2017-07-19 NOTE — Progress Notes (Signed)
[place] NOTE  Patient Care Team: Gildardo Cranker, DO as PCP - General (Internal Medicine) Elsie Stain, MD (Pulmonary Disease) Allyn Kenner, MD (Dermatology) Macarthur Critchley, OD as Referring Physician (Optometry) Rosita Kea, PA-C (Family Medicine)  CHIEF COMPLAINTS/PURPOSE OF CONSULTATION:  Leukocytosis   HISTORY OF PRESENTING ILLNESS:  Philip Hatfield 82 y.o. male is here because of recent diagnosis of leukocytosis with a white count of 25.  He was referred to [place] because he had a rapid increase in his white blood cell count.  Patient has a history of chronic gout with acute exacerbations.  He complains of pain in his hands especially his left hand is worse in his right.  Pain in his feet.  He complains of swelling of his left hand as well.   I reviewed her records extensively and collaborated the history with the patient.  MEDICAL HISTORY:  Past Medical History:  Diagnosis Date  . Actinic keratosis   . Adenomatous colon polyp    2005  . Barrett's esophagus   . CAD (coronary artery disease)   . COPD (chronic obstructive pulmonary disease) ([place])   . Diabetes mellitus   . Diverticulosis   . Emphysema   . GERD (gastroesophageal reflux disease)   . Gout   . Hearing decreased   . Hyperlipemia   . Hypertension   . Obesity     SURGICAL HISTORY: Past Surgical History:  Procedure Laterality Date  . TESTICLE SURGERY  1998    SOCIAL HISTORY: Social History   Socioeconomic History  . Marital status: Widowed    Spouse name: Not on file  . Number of children: 4  . Years of education: Not on file  . Highest education level: Not on file  Occupational History  . Occupation: retired    Comment: Administrator  . Occupation: Geophysicist/field seismologist    Comment: part time   Social Needs  . Financial resource strain: Not on file  . Food insecurity:    Worry: Not on file    Inability: Not on file  . Transportation needs:    Medical: Not on file    Non-medical: Not on  file  Tobacco Use  . Smoking status: Former Smoker    Years: 45.00    Types: Cigarettes    Last attempt to quit: 04/27/1998    Years since quitting: 19.2  . Smokeless tobacco: Never Used  . Tobacco comment: Smoked 2-3 packs daily   Substance and Sexual Activity  . Alcohol use: No    Alcohol/week: 4.2 oz    Types: 7 Cans of beer per week    Frequency: Never  . Drug use: No  . Sexual activity: Not on file  Lifestyle  . Physical activity:    Days per week: Not on file    Minutes per session: Not on file  . Stress: Not on file  Relationships  . Social connections:    Talks on phone: Not on file    Gets together: Not on file    Attends religious service: Not on file    Active member of club or organization: Not on file    Attends meetings of clubs or organizations: Not on file    Relationship status: Not on file  . Intimate partner violence:    Fear of current or ex partner: Not on file    Emotionally abused: Not on file    Physically abused: Not on file    Forced sexual activity: Not  on file  Other Topics Concern  . 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 No   Do you have difficulty preparing food or eating? No   Do you have difficulty managing your medications? No   Do you have difficulty managing your finances? No   Do you have difficulty affording your medications? Yes    FAMILY HISTORY: Family History  Problem Relation Age of Onset  . Prostate cancer Brother   . Prostate cancer Father   . Heart  disease Father   . Heart disease Mother   . Lung cancer Brother   . Colon cancer Neg Hx     ALLERGIES:  has No Known Allergies.  MEDICATIONS:  Current Outpatient Medications  Medication Sig Dispense Refill  . albuterol (PROVENTIL HFA) 108 (90 BASE) MCG/ACT inhaler Inhale 2 puffs into the lungs every 6 (six) hours as needed for wheezing.     Marland Kitchen atenolol (TENORMIN) 50 MG tablet Take 25 mg by mouth daily.     Marland Kitchen CHERRY CONCENTRATE PO Take 1,200 mg by mouth daily.    . febuxostat (ULORIC) 40 MG tablet Take 40 mg by mouth daily.    . furosemide (LASIX) 20 MG tablet Take 20 mg by mouth daily as needed for fluid.     Marland Kitchen gabapentin (NEURONTIN) 300 MG capsule Take 1 capsule (300 mg total) by mouth 2 (two) times daily. 180 capsule 0  . losartan-hydrochlorothiazide (HYZAAR) 100-25 MG per tablet Take 1 tablet by mouth daily.    . metFORMIN (GLUMETZA) 500 MG (MOD) 24 hr tablet Take 500 mg by mouth 2 (two) times daily with a meal.      . omeprazole (PRILOSEC) 20 MG capsule Take 20 mg by mouth daily.     . predniSONE (DELTASONE) 5 MG tablet Take 5-10 mg by mouth daily with breakfast.    . tamsulosin (FLOMAX) 0.4 MG CAPS capsule Take 0.4 mg by mouth daily.      No current facility-administered medications for this visit.     REVIEW OF SYSTEMS:   Constitutional: Denies fevers, chills or abnormal night sweats Eyes: Denies blurriness of vision, double vision or watery eyes Ears, nose, mouth, throat, and face: Denies mucositis or sore throat Respiratory: COPD Cardiovascular: Denies palpitation, chest discomfort or lower extremity swelling Gastrointestinal:  Denies nausea, heartburn or change in bowel habits Skin: Denies abnormal skin rashes Lymphatics: Denies new lymphadenopathy or easy bruising Neurological:Denies numbness, tingling or new weaknesses Behavioral/Psych: Mood is stable, no new changes   All other systems were reviewed with the patient and are negative.  PHYSICAL EXAMINATION: ECOG  PERFORMANCE STATUS: 1 - Symptomatic but completely ambulatory  Vitals:   07/19/17 1548  BP: (!) 112/59  Pulse: 79  Resp: 17  Temp: 97.8 F (36.6 C)  SpO2: 98%   Filed  Weights   07/19/17 1548  Weight: 227 lb 12.8 oz (103.3 kg)    GENERAL:alert, no distress and comfortable SKIN: skin color, texture, turgor are normal, no rashes or significant lesions EYES: normal, conjunctiva are pink and non-injected, sclera clear OROPHARYNX:no exudate, no erythema and lips, buccal mucosa, and tongue normal  NECK: supple, thyroid normal size, non-tender, without nodularity LYMPH:  no palpable lymphadenopathy in the cervical, axillary or inguinal LUNGS: clear to auscultation and percussion with normal breathing effort HEART: regular rate & rhythm and no murmurs and no lower extremity edema ABDOMEN:abdomen soft, non-tender and normal bowel sounds Musculoskeletal:no cyanosis of digits and no clubbing  PSYCH: alert & oriented x 3 with fluent speech NEURO: no focal motor/sensory deficits  LABORATORY DATA:  I have reviewed the data as listed Lab Results  Component Value Date   WBC 23.7 (H) 06/16/2017   HGB 11.2 (L) 06/16/2017   HCT 35.0 (L) 06/16/2017   MCV 76.9 (L) 06/16/2017   PLT 475 (H) 06/16/2017   Lab Results  Component Value Date   NA 135 06/16/2017   K 5.0 06/16/2017   CL 99 06/16/2017   CO2 25 06/16/2017    RADIOGRAPHIC STUDIES: I have personally reviewed the radiological reports and agreed with the findings in the report.  ASSESSMENT AND PLAN:  Leukocytosis Markedly elevated WBC count 23.7 on 06/17/2017 with an ANC of 18.3, hemoglobin 11.2, MCV 76.9, platelets 475  Differential diagnosis: I discussed with the patient that his elevation of white blood cell count is certainly related to underlying inflammation from his recurrent gout as well as medications especially from his prednisone therapy.  No additional blood testing is required or necessary at this time. I discussed  with him that he has a baseline of 16-18K WBC and that he is likely to have exacerbations whenever he is either put on prednisone therapy or his gout gets inflamed.  We are happy to be on board if ever he requires hematology evaluation in the future.    All questions were answered. The patient knows to call the clinic with any problems, questions or concerns.    Harriette Ohara, MD 07/19/17

## 2017-07-23 ENCOUNTER — Encounter: Payer: Medicare HMO | Admitting: Internal Medicine

## 2017-07-23 ENCOUNTER — Ambulatory Visit: Payer: Medicare HMO

## 2017-08-04 ENCOUNTER — Ambulatory Visit (INDEPENDENT_AMBULATORY_CARE_PROVIDER_SITE_OTHER): Payer: Medicare HMO | Admitting: Adult Health

## 2017-08-04 ENCOUNTER — Encounter: Payer: Self-pay | Admitting: Adult Health

## 2017-08-04 VITALS — BP 100/50 | Temp 98.0°F | Wt 218.0 lb

## 2017-08-04 DIAGNOSIS — M109 Gout, unspecified: Secondary | ICD-10-CM | POA: Diagnosis not present

## 2017-08-04 DIAGNOSIS — G629 Polyneuropathy, unspecified: Secondary | ICD-10-CM

## 2017-08-04 DIAGNOSIS — I10 Essential (primary) hypertension: Secondary | ICD-10-CM

## 2017-08-04 DIAGNOSIS — J439 Emphysema, unspecified: Secondary | ICD-10-CM

## 2017-08-04 DIAGNOSIS — E1140 Type 2 diabetes mellitus with diabetic neuropathy, unspecified: Secondary | ICD-10-CM

## 2017-08-04 DIAGNOSIS — Z7689 Persons encountering health services in other specified circumstances: Secondary | ICD-10-CM

## 2017-08-04 MED ORDER — ATENOLOL 25 MG PO TABS: 25.0000 mg | ORAL_TABLET | Freq: Every day | ORAL | 1 refills | Status: AC

## 2017-08-04 NOTE — Progress Notes (Signed)
Patient presents to clinic today to establish care. He is a pleasant 82 year old male who  has a past medical history of Actinic keratosis, Adenomatous colon polyp, Barrett's esophagus, CAD (coronary artery disease), COPD (chronic obstructive pulmonary disease) ([place]), Diabetes mellitus, Diverticulosis, Emphysema, GERD (gastroesophageal reflux disease), Gout, Hearing decreased, Hyperlipemia, Hypertension, and Obesity.   Acute Concerns: Establish Care   Chronic Issues: DM II - Takes Metformin XR 500 mg. He does not check blood sugars on a consistent basis.  Lab Results  Component Value Date   HGBA1C 6.2 (H) 06/16/2017   Gouty Arthritis - Takes uloric and prednisone. Is seen by Dr. Pearline Cables - Rheumatology. Cholchicine was added to regimen within the last week   Hypertension - Takes Hyzaar 100-25 and Atenolol 25 mg daily -  BP Readings from Last 3 Encounters:  08/04/17 (!) 100/50  07/19/17 (!) 112/59  06/16/17 140/78   - H/o hyperlipidemia - not currently taking any medication Lab Results  Component Value Date   CHOL 130 06/16/2017   HDL 42 06/16/2017   LDLCALC 71 06/16/2017   TRIG 87 06/16/2017   CHOLHDL 3.1 06/16/2017   COPD/Emphasema - Albuteral PRN.   Neuropathy - Takes Gabapentin 300 mg BID. Prescribed by Dr. Pearline Cables. This is his biggest complaint.   BPH - takes flomax. He denies any issues with urination.   Hx of GERD/Barrett's Esophagus - takes omeprazole. Has been seen by  GI - last was in 2013.   Treatment Team Rheumatology - Dr.Gray  Cardiology - Dr. Stanford Breed  Pulmonology - Dr. Camillo Flaming  Eye Doctor - Dr. Nicki Reaper    Health Maintenance: Dental -- Wear Dentures  Vision -- Routine  Immunizations -- UTD  Colonoscopy -- Unknown when last was    Past Medical History:  Diagnosis Date  . Actinic keratosis   . Adenomatous colon polyp    2005  . Barrett's esophagus   . CAD (coronary artery disease)   . COPD (chronic obstructive pulmonary disease) ([place])   .  Diabetes mellitus   . Diverticulosis   . Emphysema   . GERD (gastroesophageal reflux disease)   . Gout   . Hearing decreased   . Hyperlipemia   . Hypertension   . Obesity     Past Surgical History:  Procedure Laterality Date  . TESTICLE SURGERY  1998    Current Outpatient Medications on File Prior to Visit  Medication Sig Dispense Refill  . atenolol (TENORMIN) 50 MG tablet Take 25 mg by mouth daily.     Marland Kitchen CHERRY CONCENTRATE PO Take 1,200 mg by mouth daily.    . colchicine (COLCRYS) 0.6 MG tablet Take 1 tablet by mouth twice daily. **OV NEEDED FOR REFILLS**    . febuxostat (ULORIC) 40 MG tablet Take 40 mg by mouth daily.    . furosemide (LASIX) 20 MG tablet Take 20 mg by mouth daily as needed for fluid.     Marland Kitchen gabapentin (NEURONTIN) 300 MG capsule Take 1 capsule (300 mg total) by mouth 2 (two) times daily. 180 capsule 0  . losartan-hydrochlorothiazide (HYZAAR) 100-25 MG per tablet Take 1 tablet by mouth daily.    . metFORMIN (GLUMETZA) 500 MG (MOD) 24 hr tablet Take 500 mg by mouth 2 (two) times daily with a meal.      . omeprazole (PRILOSEC) 20 MG capsule Take 20 mg by mouth daily.     . predniSONE (DELTASONE) 5 MG tablet Take 5-10 mg by mouth daily with breakfast.    .  tamsulosin (FLOMAX) 0.4 MG CAPS capsule Take 0.4 mg by mouth daily.      No current facility-administered medications on file prior to visit.     No Known Allergies  Family History  Problem Relation Age of Onset  . Prostate cancer Brother   . Prostate cancer Father   . Heart disease Father   . Heart disease Mother   . Lung cancer Brother   . Colon cancer Neg Hx     Social History   Socioeconomic History  . Marital status: Widowed    Spouse name: Not on file  . Number of children: 4  . Years of education: Not on file  . Highest education level: Not on file  Occupational History  . Occupation: retired    Comment: Administrator  . Occupation: Geophysicist/field seismologist    Comment: part time   Social Needs  .  Financial resource strain: Not on file  . Food insecurity:    Worry: Not on file    Inability: Not on file  . Transportation needs:    Medical: Not on file    Non-medical: Not on file  Tobacco Use  . Smoking status: Former Smoker    Years: 45.00    Types: Cigarettes    Last attempt to quit: 04/27/1998    Years since quitting: 19.2  . Smokeless tobacco: Never Used  . Tobacco comment: Smoked 2-3 packs daily   Substance and Sexual Activity  . Alcohol use: No    Alcohol/week: 4.2 oz    Types: 7 Cans of beer per week    Frequency: Never  . Drug use: No  . Sexual activity: Not on file  Lifestyle  . Physical activity:    Days per week: Not on file    Minutes per session: Not on file  . Stress: Not on file  Relationships  . Social connections:    Talks on phone: Not on file    Gets together: Not on file    Attends religious service: Not on file    Active member of club or organization: Not on file    Attends meetings of clubs or organizations: Not on file    Relationship status: Not on file  . Intimate partner violence:    Fear of current or ex partner: Not on file    Emotionally abused: Not on file    Physically abused: Not on file    Forced sexual activity: Not on file  Other Topics Concern  . 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 No   Do you have difficulty preparing food or eating? No   Do you have difficulty managing your medications? No   Do you have difficulty managing your finances? No   Do you  have difficulty affording your medications? Yes  Review of Systems  Constitutional: Negative.   HENT: Positive for hearing loss.   Respiratory: Negative.   Cardiovascular: Negative.   Genitourinary: Negative.   Musculoskeletal: Positive for back pain.  Skin: Negative.   Neurological: Negative.        Neuropathy   Psychiatric/Behavioral: Negative.   All other systems reviewed and are negative.   Temp 98 F (36.7 C) (Oral)   Wt 218 lb (98.9 kg)   BMI 30.40 kg/m   Physical Exam  Constitutional: He is oriented to person, place, and time. He appears well-developed and well-nourished. No distress.  HENT:  Head: Normocephalic and atraumatic.  Right Ear: External ear normal.  Left Ear: External ear normal.  Nose: Nose normal.  Mouth/Throat: Oropharynx is clear and moist. No oropharyngeal exudate.  Eyes: Pupils are equal, round, and reactive to light. Conjunctivae and EOM are normal. Right eye exhibits no discharge. Left eye exhibits no discharge.  Neck: Normal range of motion. Neck supple.  Cardiovascular: Normal rate, regular rhythm, normal heart sounds and intact distal pulses. Exam reveals no gallop and no friction rub.  No murmur heard. Pulmonary/Chest: Effort normal and breath sounds normal. No stridor. No respiratory distress. He has no wheezes. He has no rales. He exhibits no tenderness.  Slightly reduced BS at bases   Abdominal: Soft. Bowel sounds are normal. He exhibits no distension and no mass. There is no tenderness. There is no rebound and no guarding. No hernia.  Musculoskeletal: Normal range of motion. He exhibits no edema, tenderness or deformity.  Neurological: He is alert and oriented to person, place, and time. No cranial nerve deficit. Coordination normal.  Skin: Skin is warm and dry. No rash noted. No erythema. No pallor.  Discoloration to bilateral lower extremities   Psychiatric: He has a normal mood and affect. His behavior is normal. Judgment and thought  content normal.  Nursing note and vitals reviewed.   Recent Results (from the past 2160 hour(s))  CBC with Differential/Platelets     Status: Abnormal   Collection Time: 06/16/17 12:07 PM  Result Value Ref Range   WBC 23.7 (H) 3.8 - 10.8 Thousand/uL   RBC 4.55 4.20 - 5.80 Million/uL   Hemoglobin 11.2 (L) 13.2 - 17.1 g/dL   HCT 35.0 (L) 38.5 - 50.0 %   MCV 76.9 (L) 80.0 - 100.0 fL   MCH 24.6 (L) 27.0 - 33.0 pg   MCHC 32.0 32.0 - 36.0 g/dL   RDW 17.5 (H) 11.0 - 15.0 %   Platelets 475 (H) 140 - 400 Thousand/uL   MPV 10.5 7.5 - 12.5 fL   Neutro Abs 18,344 (H) 1,500 - 7,800 cells/uL   Lymphs Abs 3,342 850 - 3,900 cells/uL   WBC mixed population 1,778 (H) 200 - 950 cells/uL   Eosinophils Absolute 119 15 - 500 cells/uL   Basophils Absolute 119 0 - 200 cells/uL   Neutrophils Relative % 77.4 %   Total Lymphocyte 14.1 %   Monocytes Relative 7.5 %   Eosinophils Relative 0.5 %   Basophils Relative 0.5 %  CMP with eGFR([facility])     Status: Abnormal   Collection Time: 06/16/17 12:07 PM  Result Value Ref Range   Glucose, Bld 103 (H) 65 - 99 mg/dL    Comment: .            Fasting reference interval . For someone without known diabetes, a glucose value between 100 and 125 mg/dL is consistent with prediabetes and should be confirmed with a follow-up  test. .    BUN 37 (H) 7 - 25 mg/dL   Creat 1.13 0.70 - 1.18 mg/dL    Comment: For patients >74 years of age, the reference limit for Creatinine is approximately 13% higher for people identified as African-American. .    GFR, Est Non African American 61 > OR = 60 mL/min/1.69m   GFR, Est African American 71 > OR = 60 mL/min/1.744m  BUN/Creatinine Ratio 33 (H) 6 - 22 (calc)   Sodium 135 135 - 146 mmol/L   Potassium 5.0 3.5 - 5.3 mmol/L   Chloride 99 98 - 110 mmol/L   CO2 25 20 - 32 mmol/L   Calcium 8.8 8.6 - 10.3 mg/dL   Total Protein 6.5 6.1 - 8.1 g/dL   Albumin 3.2 (L) 3.6 - 5.1 g/dL   Globulin 3.3 1.9 - 3.7 g/dL (calc)   AG Ratio  1.0 1.0 - 2.5 (calc)   Total Bilirubin 0.5 0.2 - 1.2 mg/dL   Alkaline phosphatase (APISO) 83 40 - 115 U/L   AST 17 10 - 35 U/L   ALT 31 9 - 46 U/L  Lipid Panel     Status: None   Collection Time: 06/16/17 12:07 PM  Result Value Ref Range   Cholesterol 130 <200 mg/dL   HDL 42 >40 mg/dL   Triglycerides 87 <150 mg/dL   LDL Cholesterol (Calc) 71 mg/dL (calc)    Comment: Reference range: <100 . Desirable range <100 mg/dL for primary prevention;   <70 mg/dL for patients with CHD or diabetic patients  with > or = 2 CHD risk factors. . Marland KitchenDL-C is now calculated using the Martin-Hopkins  calculation, which is a validated novel method providing  better accuracy than the Friedewald equation in the  estimation of LDL-C.  MaCresenciano Genret al. JAAnnamaria [id number]  (http://education.[facility].com/faq/FAQ164)    Total CHOL/HDL Ratio 3.1 <5.0 (calc)   Non-HDL Cholesterol (Calc) 88 <130 mg/dL (calc)    Comment: For patients with diabetes plus 1 major ASCVD risk  factor, treating to a non-HDL-C goal of <100 mg/dL  (LDL-C of <70 mg/dL) is considered a therapeutic  option.   TSH     Status: None   Collection Time: 06/16/17 12:07 PM  Result Value Ref Range   TSH 1.32 0.40 - 4.50 mIU/L  Hemoglobin A1c     Status: Abnormal   Collection Time: 06/16/17 12:07 PM  Result Value Ref Range   Hgb A1c MFr Bld 6.2 (H) <5.7 % of total Hgb    Comment: For someone without known diabetes, a hemoglobin  A1c value between 5.7% and 6.4% is consistent with prediabetes and should be confirmed with a  follow-up test. . For someone with known diabetes, a value <7% indicates that their diabetes is well controlled. A1c targets should be individualized based on duration of diabetes, age, comorbid conditions, and other considerations. . This assay result is consistent with an increased risk of diabetes. . Currently, no consensus exists regarding use of hemoglobin A1c for diagnosis of diabetes for  children. .    Mean Plasma Glucose 131 (calc)   eAG (mmol/L) 7.3 (calc)  Microalbumin/Creatinine Ratio, Urine     Status: None   Collection Time: 06/16/17 12:07 PM  Result Value Ref Range   Creatinine, Urine 126 20 - 320 mg/dL   Microalb, Ur 1.9 mg/dL    Comment: Reference Range Not established    Microalb Creat Ratio 15 <30 mcg/mg creat    Comment: . The ADA defines  abnormalities in albumin excretion as follows: Marland Kitchen Category         Result (mcg/mg creatinine) . Normal                    <30 Microalbuminuria         30-299  Clinical albuminuria   > OR = 300 . The ADA recommends that at least two of three specimens collected within a 3-6 month period be abnormal before considering a patient to be within a diagnostic category.   Urinalysis with Reflex Microscopic     Status: Abnormal   Collection Time: 06/16/17 12:07 PM  Result Value Ref Range   Color, Urine DARK YELLOW YELLOW   APPearance CLEAR CLEAR   Specific Gravity, Urine 1.023 1.001 - 1.03   pH < OR = 5.0 5.0 - 8.0   Glucose, UA NEGATIVE NEGATIVE   Bilirubin Urine NEGATIVE NEGATIVE   Ketones, ur NEGATIVE NEGATIVE   Hgb urine dipstick NEGATIVE NEGATIVE   Protein, ur NEGATIVE NEGATIVE   Nitrite NEGATIVE NEGATIVE   Leukocytes, UA 1+ (A) NEGATIVE   WBC, UA 6-10 (A) 0 - 5 /HPF   RBC / HPF 0-2 0 - 2 /HPF   Squamous Epithelial / LPF NONE SEEN < OR = 5 /HPF   Bacteria, UA NONE SEEN NONE SEEN /HPF   Hyaline Cast NONE SEEN NONE SEEN /LPF    Assessment/Plan: 1. Encounter to establish care   2. Essential hypertension - Will have daughter decrease Hyzaar to 1/2 pill do to hypotension   - He is returning in 3 weeks for A1c recheck. Will reevaluate at this time  - atenolol (TENORMIN) 25 MG tablet; Take 1 tablet (25 mg total) by mouth daily.  Dispense: 90 tablet; Refill: 1  3. Pulmonary emphysema, unspecified emphysema type ([place]) - Continue with POC by pulmonary   4. Type 2 diabetes mellitus with diabetic neuropathy,  without long-term current use of insulin ([place]) - Follow up next month for repeat A1c - Ambulatory referral to Podiatry  5. Gouty arthritis - Continue with POC by Rheumatology   6. Neuropathy - Biggest complaint. I am going to have him continue with current dose until he is seen by Dr. Pearline Cables next month   Dorothyann Peng, NP

## 2017-08-04 NOTE — Patient Instructions (Signed)
It was great meeting you today   I am going to have you decrease your losartan-HCTZ pill, cut it in half   Someone from podiatry will call to make an appointment   Follow up in one month for blood pressure and diabetes check

## 2017-08-08 ENCOUNTER — Encounter: Payer: Self-pay | Admitting: Adult Health

## 2017-08-08 NOTE — Progress Notes (Signed)
Received call from [facility] around 7pm 04-Sep-2022. Pt passed away at home. EMS and and police at the home and spoke with family and they feel patient passed away of natural causes and that this is not a ME case. Family does not want autopsy. They are letting us know that they will send death certificate to the PCP. See Nursing note for details, time of death, etc.

## 2017-08-19 ENCOUNTER — Ambulatory Visit: Payer: Medicare HMO | Admitting: Podiatry

## 2017-08-25 DEATH — deceased

## 2017-09-17 ENCOUNTER — Ambulatory Visit: Payer: Medicare HMO | Admitting: Adult Health

## 2017-09-23 ENCOUNTER — Ambulatory Visit: Payer: Medicare HMO | Admitting: Cardiology

## 2018-09-11 IMAGING — CT CT HEAD W/O CM
3 of 8 series · 13 of 47 positions shown, 15 images · non-contrast
Comparison: None.

CLINICAL DATA: Fall off bed onto floor.

EXAM:
CT HEAD WITHOUT CONTRAST
CT CERVICAL SPINE WITHOUT CONTRAST
TECHNIQUE: Multidetector CT imaging of the head and cervical spine was
performed following the standard protocol without intravenous
contrast. Multiplanar CT image reconstructions of the cervical spine
were also generated.

[Series 5: coronal · coronal · 0.27mm/px · 3 of 66 slices shown]
[im 14/66  brain]
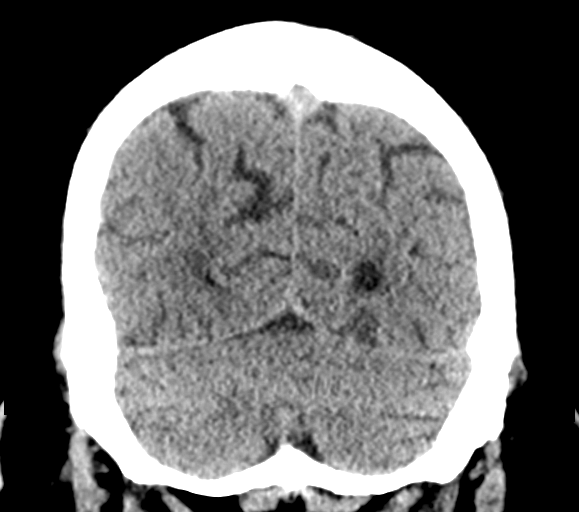
[im 27/66  brain]
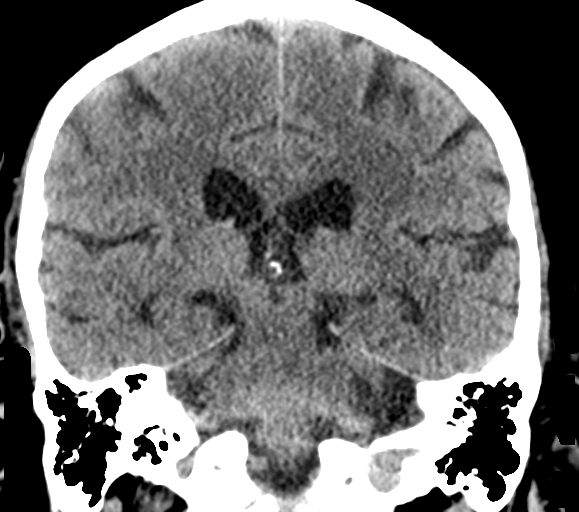
[im 40/66  brain]
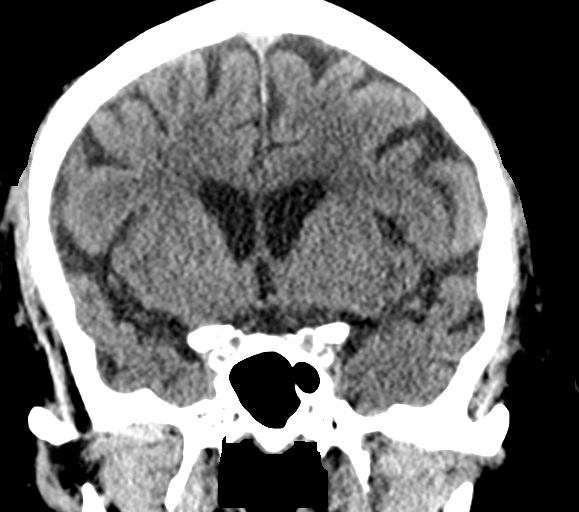

[Series 9: axial recon · axial · 0.21mm/px · z∈[+1095,+1225]mm · 8 of 89 slices shown, 10 images]
[im 10/89  brain]
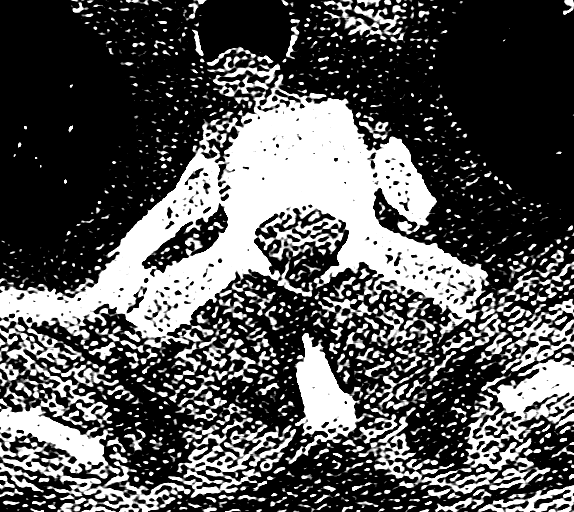
[im 10/89  bone]
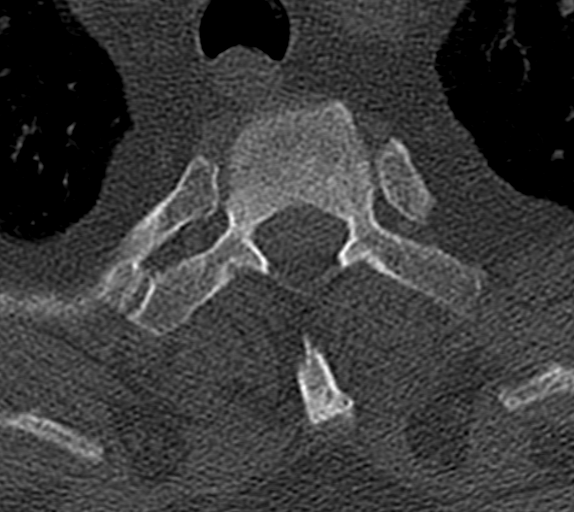
[im 20/89  brain]
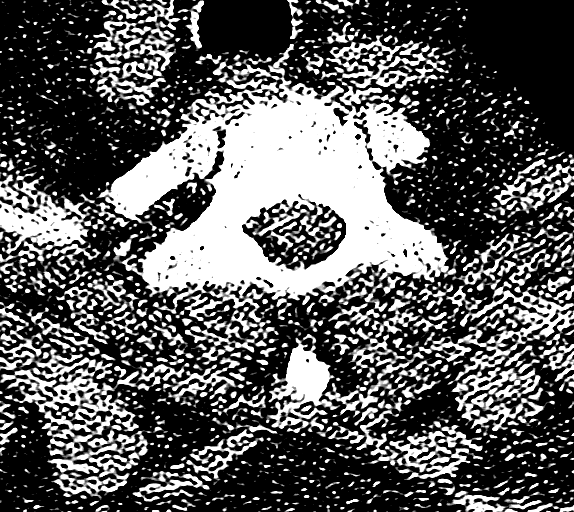
[im 30/89  brain]
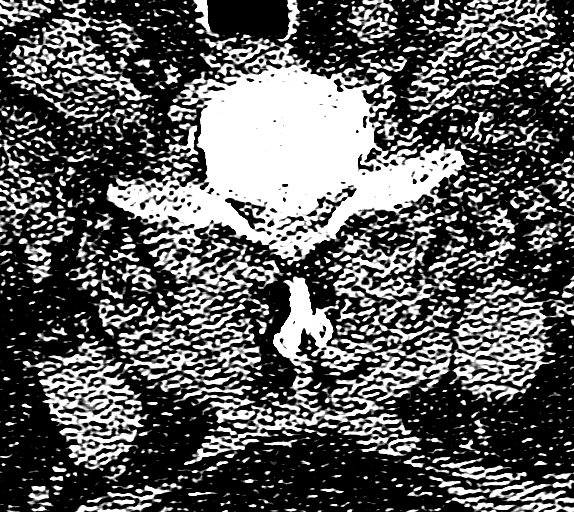
[im 40/89  brain]
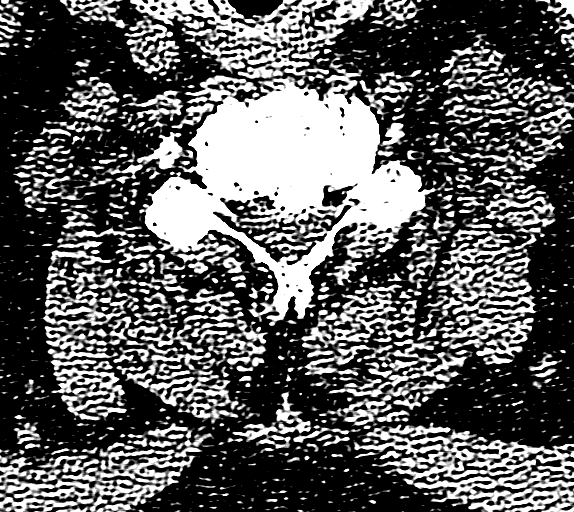
[im 49/89  brain]
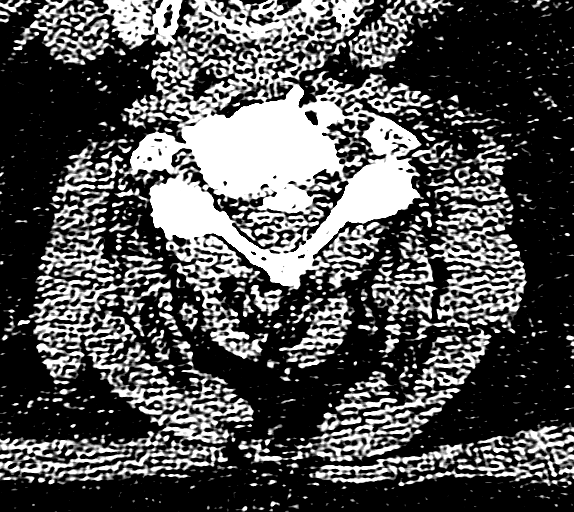
[im 49/89  bone]
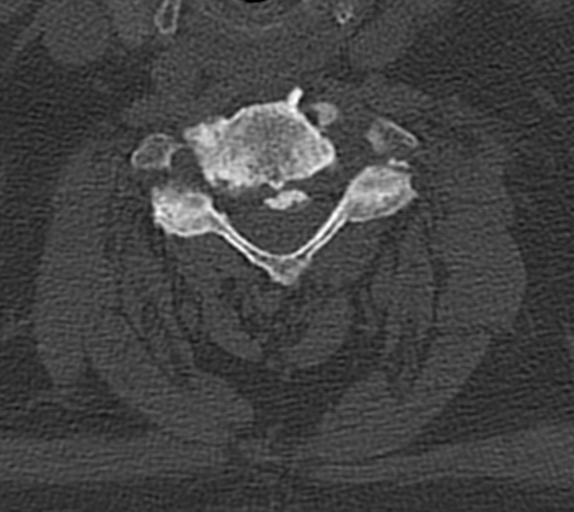
[im 59/89  brain]
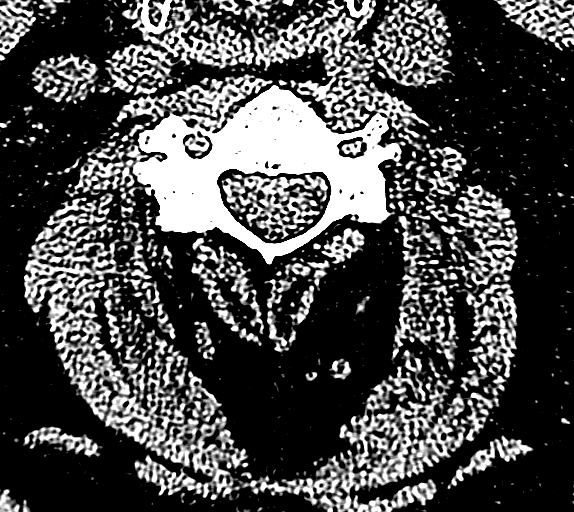
[im 69/89  brain]
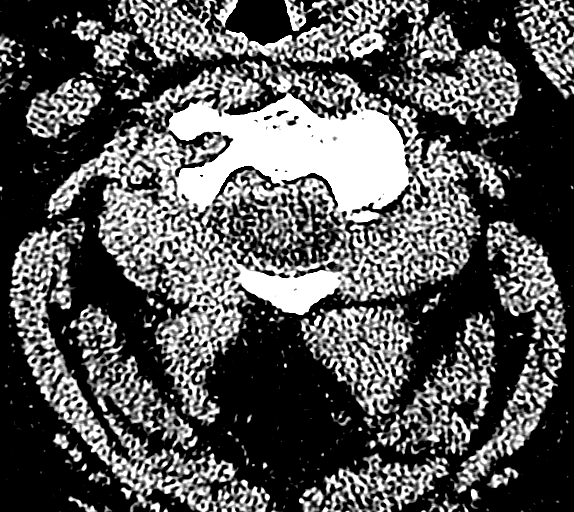
[im 79/89  brain]
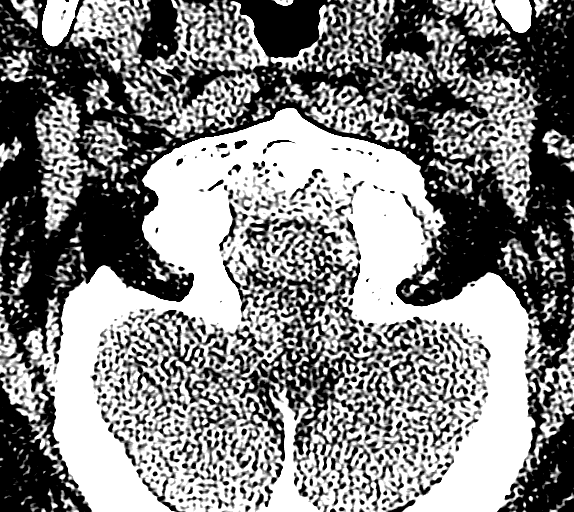

[Series 11: sagittal · sagittal · 0.21mm/px · 2 of 61 slices shown]
[im 21/61  brain]
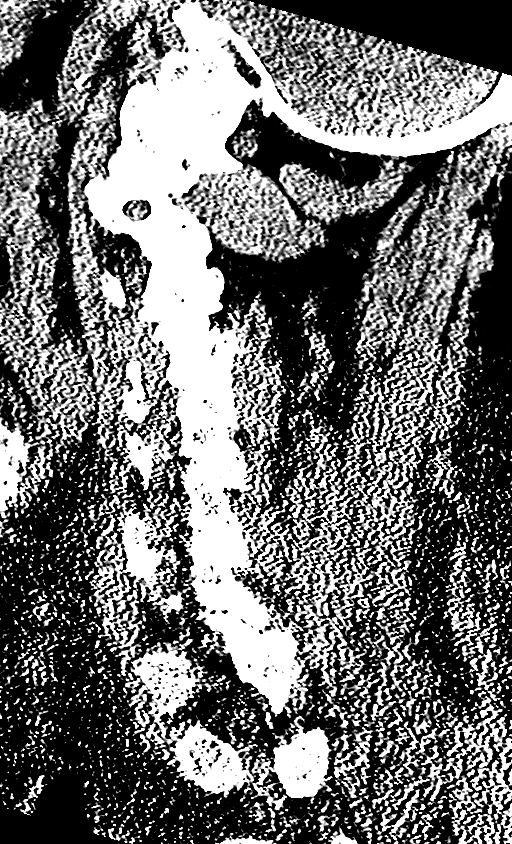
[im 41/61  brain]
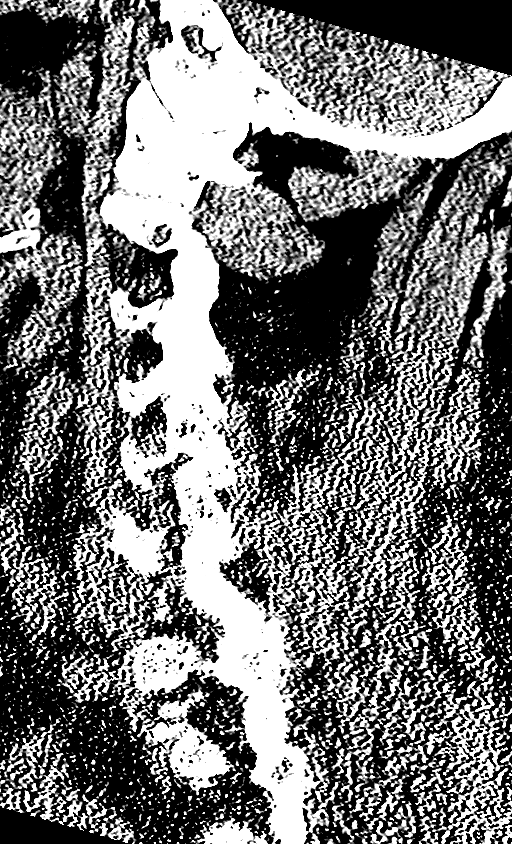

[13 of 47 positions shown; findings below may reference images not displayed]

FINDINGS: CT HEAD FINDINGS

Brain: Mild generalized cerebral and cerebellar atrophy. No
intracranial hemorrhage, evidence of acute ischemia, mass effect or
midline shift. No subdural or extra-axial fluid collection.

Vascular: Atherosclerosis of skullbase vasculature. No hyperdense
vessel.

Skull: Normal. Negative for fracture or focal lesion.

Sinuses/Orbits: No acute finding.

Other: None.

CT CERVICAL SPINE FINDINGS

Alignment: Straightening of normal lordosis. Trace anterolisthesis
of C3 on C4. Trace anterolisthesis of C7 on T1.

Skull base and vertebrae: No acute fracture. Well-defined lucency
noted in the left aspect of the base of the odontoid with
well-defined margins.

Soft tissues and spinal canal: No spinal canal hematoma.

Disc levels: There is ossification of the posterior longitudinal
ligament from C4 through C6. Calcifications posteriorly about the
C6-C7 disc space may be calcified disc osteophyte complex or
associated longitudinal ossifications. This ossification leads to
narrowing of the spinal canal as well as bilateral neural foramina
at these levels. There is diffuse disc space narrowing and endplate
spurring. Probable segmentation anomaly at C2-C3. There is
multilevel facet arthropathy.

Upper chest: No acute abnormality.

Other: None.
IMPRESSION: 1.  No acute intracranial abnormality.
2. No evidence of acute fracture or subluxation of the cervical
spine.
3. Multilevel degenerative change throughout cervical spine.
Ossification of the posterior longitudinal ligament and degenerative
change causes spinal canal and neural foraminal narrowing from C4
through C7.
4. Lucencies at the base of the odontoid may be secondary to chronic
osteoarthritis or sequela of inflammatory arthropathy such as gout.
